# Patient Record
Sex: Female | Born: 1940 | Race: White | Hispanic: No | Smoking: Former smoker
Health system: Southern US, Community
[De-identification: ages and names within clinical notes are randomized; demographics above are authoritative.]

## PROBLEM LIST (undated history)

## (undated) DIAGNOSIS — E119 Type 2 diabetes mellitus without complications: Secondary | ICD-10-CM

## (undated) DIAGNOSIS — C801 Malignant (primary) neoplasm, unspecified: Secondary | ICD-10-CM

## (undated) DIAGNOSIS — I2510 Atherosclerotic heart disease of native coronary artery without angina pectoris: Secondary | ICD-10-CM

## (undated) DIAGNOSIS — I10 Essential (primary) hypertension: Secondary | ICD-10-CM

## (undated) DIAGNOSIS — F419 Anxiety disorder, unspecified: Secondary | ICD-10-CM

## (undated) HISTORY — PX: CORONARY STENT PLACEMENT: SHX1402

## (undated) HISTORY — PX: CHOLECYSTECTOMY: SHX55

## (undated) HISTORY — PX: BREAST BIOPSY: SHX20

## (undated) HISTORY — PX: ABDOMINAL HYSTERECTOMY: SHX81

---

## 2004-09-27 ENCOUNTER — Ambulatory Visit: Payer: Self-pay

## 2006-08-31 ENCOUNTER — Ambulatory Visit: Payer: Self-pay | Admitting: Family Medicine

## 2007-04-11 ENCOUNTER — Ambulatory Visit: Payer: Self-pay | Admitting: Family Medicine

## 2007-10-08 ENCOUNTER — Ambulatory Visit: Payer: Self-pay | Admitting: Family Medicine

## 2008-10-23 ENCOUNTER — Ambulatory Visit: Payer: Self-pay | Admitting: Family Medicine

## 2008-11-21 ENCOUNTER — Ambulatory Visit: Payer: Self-pay | Admitting: Family Medicine

## 2009-12-29 ENCOUNTER — Ambulatory Visit: Payer: Self-pay | Admitting: Family Medicine

## 2010-04-02 ENCOUNTER — Ambulatory Visit: Payer: Self-pay | Admitting: Family Medicine

## 2010-06-18 ENCOUNTER — Emergency Department: Payer: Self-pay | Admitting: Emergency Medicine

## 2010-06-18 ENCOUNTER — Ambulatory Visit: Payer: Self-pay | Admitting: Internal Medicine

## 2011-03-14 ENCOUNTER — Ambulatory Visit: Payer: Self-pay | Admitting: Internal Medicine

## 2011-04-06 ENCOUNTER — Ambulatory Visit: Payer: Self-pay | Admitting: Family Medicine

## 2011-07-13 DIAGNOSIS — F329 Major depressive disorder, single episode, unspecified: Secondary | ICD-10-CM | POA: Insufficient documentation

## 2012-04-06 ENCOUNTER — Ambulatory Visit: Payer: Self-pay | Admitting: Family Medicine

## 2012-08-23 ENCOUNTER — Ambulatory Visit: Payer: Self-pay | Admitting: Emergency Medicine

## 2012-10-02 DIAGNOSIS — M6208 Separation of muscle (nontraumatic), other site: Secondary | ICD-10-CM | POA: Insufficient documentation

## 2013-04-19 ENCOUNTER — Ambulatory Visit: Payer: Self-pay | Admitting: Family Medicine

## 2013-07-03 DIAGNOSIS — M7071 Other bursitis of hip, right hip: Secondary | ICD-10-CM | POA: Insufficient documentation

## 2013-10-16 DIAGNOSIS — E1165 Type 2 diabetes mellitus with hyperglycemia: Secondary | ICD-10-CM

## 2013-10-16 DIAGNOSIS — E1159 Type 2 diabetes mellitus with other circulatory complications: Secondary | ICD-10-CM | POA: Insufficient documentation

## 2013-10-16 DIAGNOSIS — IMO0002 Reserved for concepts with insufficient information to code with codable children: Secondary | ICD-10-CM | POA: Insufficient documentation

## 2013-10-16 DIAGNOSIS — Z794 Long term (current) use of insulin: Secondary | ICD-10-CM

## 2013-10-28 ENCOUNTER — Ambulatory Visit: Payer: Self-pay | Admitting: Surgery

## 2013-10-28 DIAGNOSIS — I2510 Atherosclerotic heart disease of native coronary artery without angina pectoris: Secondary | ICD-10-CM

## 2013-10-28 DIAGNOSIS — Z01810 Encounter for preprocedural cardiovascular examination: Secondary | ICD-10-CM

## 2013-10-28 LAB — BASIC METABOLIC PANEL
ANION GAP: 10 (ref 7–16)
BUN: 15 mg/dL (ref 7–18)
CALCIUM: 9.1 mg/dL (ref 8.5–10.1)
CREATININE: 0.58 mg/dL — AB (ref 0.60–1.30)
Chloride: 106 mmol/L (ref 98–107)
Co2: 25 mmol/L (ref 21–32)
EGFR (African American): >60
Glucose: 163 mg/dL — ABNORMAL HIGH (ref 65–99)
Osmolality: 286 (ref 275–301)
Potassium: 4.1 mmol/L (ref 3.5–5.1)
SODIUM: 141 mmol/L (ref 136–145)

## 2013-10-28 LAB — CBC WITH DIFFERENTIAL/PLATELET
Basophil #: 0 10*3/uL (ref 0.0–0.1)
Basophil %: 0.7 %
Eosinophil #: 0.2 10*3/uL (ref 0.0–0.7)
Eosinophil %: 2.7 %
HCT: 39.4 % (ref 35.0–47.0)
HGB: 12.6 g/dL (ref 12.0–16.0)
LYMPHS ABS: 1.5 10*3/uL (ref 1.0–3.6)
Lymphocyte %: 26.3 %
MCH: 30.4 pg (ref 26.0–34.0)
MCHC: 31.9 g/dL — ABNORMAL LOW (ref 32.0–36.0)
MCV: 95 fL (ref 80–100)
MONO ABS: 0.4 x10 3/mm (ref 0.2–0.9)
Monocyte %: 7.3 %
NEUTROS PCT: 63 %
Neutrophil #: 3.6 10*3/uL (ref 1.4–6.5)
Platelet: 168 10*3/uL (ref 150–440)
RBC: 4.13 10*6/uL (ref 3.80–5.20)
RDW: 13.4 % (ref 11.5–14.5)
WBC: 5.7 10*3/uL (ref 3.6–11.0)

## 2013-11-04 ENCOUNTER — Ambulatory Visit: Payer: Self-pay | Admitting: Surgery

## 2013-11-06 LAB — PATHOLOGY REPORT

## 2013-12-04 ENCOUNTER — Ambulatory Visit: Payer: Self-pay | Admitting: Family Medicine

## 2014-05-13 ENCOUNTER — Ambulatory Visit: Payer: Self-pay | Admitting: Family Medicine

## 2014-05-31 ENCOUNTER — Ambulatory Visit
Admit: 2014-05-31 | Disposition: A | Discharge status: Home / Self Care | Payer: Self-pay | Attending: Family Medicine | Admitting: Family Medicine

## 2014-06-07 ENCOUNTER — Ambulatory Visit
Admit: 2014-06-07 | Disposition: A | Discharge status: Home / Self Care | Payer: Self-pay | Attending: Family Medicine | Admitting: Family Medicine

## 2014-06-07 NOTE — Op Note (Signed)
PATIENT NAME:  Danielle Vang, Danielle Vang MR#:  [id number] DATE OF BIRTH:  February 18, 1940  DATE OF PROCEDURE:  11/04/2013  PREOPERATIVE DIAGNOSIS: Thin melanoma left upper mid back.   POSTOPERATIVE DIAGNOSIS:  Thin melanoma left upper mid back.   PROCEDURE PERFORMED: Wide local re-excision with 1 cm margin and rhomboid rotational flap closure.   SURGEON: Sherri Rad, MD.   ASSISTANT: None.   ANESTHESIA: General with local.   FINDINGS: A 1 cm gross margin from the edge of the ulceration, Grossly negative by pathological analysis.   SPECIMENS: As described above with margin markers.   ESTIMATED BLOOD LOSS: Minimal.   DRAINS: None.   COUNTS:  Lap and needle count correct x 2.   DESCRIPTION OF PROCEDURE: With informed consent the patient was brought to the operating room and positioned supine. General anesthesia was induced. The patient was then padded and positioned prone position. Her back was sterilely prepped and draped with ChloraPrep solution. Timeout was observed. The lesion was identified in the left upper mid back. A rhomboid with 60 degree and 120 degree opposing angles was fashioned. A Limberg flap was then raised, undermined. The defect was then closed in a standard fashion utilizing interrupted deep dermal 3-0 Vicryl sutures and a combination of 3-0 and 4-0 nylon simple and vertical mattress suture. Tension was not found in any of the angles. The flap was viable at the completion of the operation. With lap and needle count correct x 2 dressing was applied and the patient was then returned supine, extubated, and taken to the recovery room in stable and satisfactory condition by anesthesia services.     ____________________________ Jeannette How Marina Gravel, MD mab:bu D: 11/04/2013 17:45:42 ET T: 11/04/2013 21:34:08 ET JOB#: [id number]  cc: Elta Guadeloupe A. Marina Gravel, MD, <Dictator> Gladstone Pih, MD Sevag Shearn Bettina Gavia MD ELECTRONICALLY SIGNED 11/09/2013 16:37

## 2014-08-19 ENCOUNTER — Ambulatory Visit
Admission: EM | Admit: 2014-08-19 | Discharge: 2014-08-19 | Disposition: A | Discharge status: Home / Self Care | Payer: Medicare HMO | Attending: Family Medicine | Admitting: Family Medicine

## 2014-08-19 ENCOUNTER — Ambulatory Visit: Admission: EM | Admit: 2014-08-19 | Discharge: 2014-08-19 | Discharge status: Absent without leave | Payer: Self-pay

## 2014-08-19 DIAGNOSIS — S29012A Strain of muscle and tendon of back wall of thorax, initial encounter: Secondary | ICD-10-CM | POA: Diagnosis not present

## 2014-08-19 HISTORY — DX: Anxiety disorder, unspecified: F41.9

## 2014-08-19 HISTORY — DX: Type 2 diabetes mellitus without complications: E11.9

## 2014-08-19 HISTORY — DX: Essential (primary) hypertension: I10

## 2014-08-19 MED ORDER — HYDROCODONE-ACETAMINOPHEN 5-325 MG PO TABS: 1.0000 | ORAL_TABLET | Freq: Four times a day (QID) | ORAL | Status: DC | PRN

## 2014-08-19 MED ORDER — IBUPROFEN 800 MG PO TABS: 800.0000 mg | ORAL_TABLET | Freq: Three times a day (TID) | ORAL | Status: DC

## 2014-08-19 NOTE — ED Notes (Signed)
Pt states "neck and shoulder pain that started yesterday."

## 2014-08-19 NOTE — ED Provider Notes (Signed)
CSN: [id number]     Arrival date & time 08/19/14  0929 History   First MD Initiated Contact with Patient 08/19/14 1153     Chief Complaint  Patient presents with  . Shoulder Pain   (Consider location/radiation/quality/duration/timing/severity/associated sxs/prior Treatment) HPI Comments: 74 yo female with a 3 days h/o right upper back and neck pain radiating into the shoulder area. States woke up 3 days ago with the pain. Denies any injuries, trauma, falls, numbness/tingling, rash.   The history is provided by the patient.    Past Medical History  Diagnosis Date  . Hypertension   . Diabetes mellitus without complication   . Anxiety    Past Surgical History  Procedure Laterality Date  . Cholecystectomy    . Coronary stent placement    . Abdominal hysterectomy     No family history on file. History  Substance Use Topics  . Smoking status: Never Smoker   . Smokeless tobacco: Not on file  . Alcohol Use: No   OB History    No data available     Review of Systems  Allergies  Review of patient's allergies indicates no known allergies.  Home Medications   Prior to Admission medications   Medication Sig Start Date End Date Taking? Authorizing Provider  ALPRAZolam Duanne Moron) 0.5 MG tablet Take 0.5 mg by mouth at bedtime as needed for anxiety.   Yes Historical Provider, MD  aspirin EC 325 MG tablet Take 325 mg by mouth daily.   Yes Historical Provider, MD  atenolol (TENORMIN) 25 MG tablet Take by mouth daily.   Yes Historical Provider, MD  glipiZIDE (GLUCOTROL) 5 MG tablet Take by mouth daily before breakfast.   Yes Historical Provider, MD  lisinopril (PRINIVIL,ZESTRIL) 10 MG tablet Take 10 mg by mouth daily.   Yes Historical Provider, MD  metFORMIN (GLUCOPHAGE) 1000 MG tablet Take 1,000 mg by mouth 2 (two) times daily with a meal.   Yes Historical Provider, MD  HYDROcodone-acetaminophen (NORCO/VICODIN) 5-325 MG per tablet Take 1-2 tablets by mouth every 6 (six) hours as needed.  08/19/14   Norval Gable, MD  ibuprofen (ADVIL,MOTRIN) 800 MG tablet Take 1 tablet (800 mg total) by mouth 3 (three) times daily. 08/19/14   Norval Gable, MD   BP 148/59 mmHg  Pulse 60  Temp(Src) 98.4 F (36.9 C) (Tympanic)  Resp 16  Ht 5\' 2"  (1.575 m)  Wt 160 lb (72.576 kg)  BMI 29.26 kg/m2  LMP  Physical Exam  Constitutional: She appears well-developed and well-nourished. No distress.  Musculoskeletal: She exhibits no edema.       Cervical back: She exhibits tenderness and spasm. She exhibits normal range of motion, no bony tenderness, no swelling, no edema, no deformity, no laceration and normal pulse.  Tenderness to palpation and muscle spasm over the right trapezius muscle; no bony tenderness; normal ROM of right shoulder, neurovascularly intact  Neurological: She has normal reflexes. She displays normal reflexes. She exhibits normal muscle tone. Coordination normal.  Skin: Skin is warm. No rash noted. She is not diaphoretic. No erythema.  Nursing note and vitals reviewed.   ED Course  Procedures (including critical care time) Labs Review Labs Reviewed - No data to display  Imaging Review No results found.   MDM   1. Muscle strain of right upper back, initial encounter    Discharge Medication List as of 08/19/2014 12:04 PM    START taking these medications   Details  HYDROcodone-acetaminophen (NORCO/VICODIN) 5-325 MG per tablet Take  1-2 tablets by mouth every 6 (six) hours as needed., Starting 08/19/2014, Until Discontinued, Print    ibuprofen (ADVIL,MOTRIN) 800 MG tablet Take 1 tablet (800 mg total) by mouth 3 (three) times daily., Starting 08/19/2014, Until Discontinued, Normal      Plan: 1. diagnosis reviewed with patient 2. rx as per orders; risks, benefits, potential side effects reviewed with patient 3. Recommend supportive treatment with gentle stretching and ROM exercises, ice/heat, massage 4. F/u prn if symptoms worsen or don't improve    Norval Gable,  MD 08/19/14 1452

## 2015-07-31 ENCOUNTER — Other Ambulatory Visit: Payer: Self-pay | Admitting: Family Medicine

## 2015-07-31 DIAGNOSIS — Z1231 Encounter for screening mammogram for malignant neoplasm of breast: Secondary | ICD-10-CM

## 2015-08-17 ENCOUNTER — Ambulatory Visit: Admission: RE | Admit: 2015-08-17 | Payer: Medicare HMO | Source: Ambulatory Visit

## 2015-09-14 ENCOUNTER — Ambulatory Visit
Admission: RE | Admit: 2015-09-14 | Discharge: 2015-09-14 | Disposition: A | Discharge status: Home / Self Care | Payer: Medicare HMO | Source: Ambulatory Visit | Attending: Family Medicine | Admitting: Family Medicine

## 2015-09-14 ENCOUNTER — Encounter: Payer: Self-pay | Admitting: Radiology

## 2015-09-14 DIAGNOSIS — Z1231 Encounter for screening mammogram for malignant neoplasm of breast: Secondary | ICD-10-CM | POA: Insufficient documentation

## 2015-09-14 HISTORY — DX: Malignant (primary) neoplasm, unspecified: C80.1

## 2016-04-20 ENCOUNTER — Other Ambulatory Visit: Payer: Self-pay | Admitting: Family Medicine

## 2016-04-20 DIAGNOSIS — Z1231 Encounter for screening mammogram for malignant neoplasm of breast: Secondary | ICD-10-CM

## 2016-06-15 ENCOUNTER — Ambulatory Visit: Payer: Worker's Compensation

## 2016-06-15 ENCOUNTER — Encounter: Payer: Self-pay | Admitting: *Deleted

## 2016-06-15 ENCOUNTER — Ambulatory Visit
Admission: EM | Admit: 2016-06-15 | Discharge: 2016-06-15 | Disposition: A | Discharge status: Home / Self Care | Payer: Worker's Compensation | Attending: Family Medicine | Admitting: Family Medicine

## 2016-06-15 ENCOUNTER — Ambulatory Visit (INDEPENDENT_AMBULATORY_CARE_PROVIDER_SITE_OTHER): Payer: Worker's Compensation

## 2016-06-15 DIAGNOSIS — S022XXA Fracture of nasal bones, initial encounter for closed fracture: Secondary | ICD-10-CM

## 2016-06-15 DIAGNOSIS — S0083XA Contusion of other part of head, initial encounter: Secondary | ICD-10-CM

## 2016-06-15 DIAGNOSIS — W19XXXA Unspecified fall, initial encounter: Secondary | ICD-10-CM

## 2016-06-15 HISTORY — DX: Atherosclerotic heart disease of native coronary artery without angina pectoris: I25.10

## 2016-06-15 MED ORDER — HYDROCODONE-ACETAMINOPHEN 5-325 MG PO TABS: ORAL_TABLET | ORAL | 0 refills | Status: DC

## 2016-06-15 NOTE — ED Triage Notes (Signed)
Pt tripped on stairs at work and landed on face. Now c/o facial pain, abrasions, and edema. Also, hematoma to left frontal region, and abrasions to both knees.

## 2016-06-15 NOTE — ED Provider Notes (Signed)
[facility]    CSN: [id number] Arrival date & time: 06/15/16  1701     History   Chief Complaint Chief Complaint  Patient presents with  . Facial Injury    HPI Danielle Vang is a 76 y.o. female.   The history is provided by the patient.  Facial Injury  Mechanism of injury:  Fall (missed a step going down some stairs at work and fell hitting her face; denies loss of consciousness, vision changes, nose bleed; pain over bridge of nose and over left eyebrow along with swelling) Location:  Face and nose Time since incident:  3 hours Pain details:    Quality:  Aching   Timing:  Constant Foreign body present:  No foreign bodies Relieved by:  None tried Ineffective treatments:  None tried Associated symptoms: no altered mental status, no congestion, no difficulty breathing, no double vision, no ear pain, no epistaxis, no headaches, no loss of consciousness, no malocclusion, no nausea, no neck pain, no rhinorrhea, no trismus, no vomiting and no wheezing   Risk factors: no alcohol use, no bone disorder, no concern for non-accidental trauma, no frequent falls and no prior injuries to these areas     Past Medical History:  Diagnosis Date  . Anxiety   . Cancer ([place])    skin ca  . Coronary artery disease   . Diabetes mellitus without complication ([place])   . Hypertension     There are no active problems to display for this patient.   Past Surgical History:  Procedure Laterality Date  . ABDOMINAL HYSTERECTOMY    . BREAST BIOPSY Left    neg  . CHOLECYSTECTOMY    . CORONARY STENT PLACEMENT      OB History    No data available       Home Medications    Prior to Admission medications   Medication Sig Start Date End Date Taking? Authorizing Provider  ALPRAZolam Duanne Moron) 0.5 MG tablet Take 0.5 mg by mouth at bedtime as needed for anxiety.   Yes Historical Provider, MD  aspirin EC 325 MG tablet Take 325 mg by mouth daily.   Yes Historical Provider, MD    atenolol (TENORMIN) 25 MG tablet Take by mouth daily.   Yes Historical Provider, MD  glipiZIDE (GLUCOTROL) 5 MG tablet Take by mouth daily before breakfast.   Yes Historical Provider, MD  lisinopril (PRINIVIL,ZESTRIL) 10 MG tablet Take 10 mg by mouth daily.   Yes Historical Provider, MD  metFORMIN (GLUCOPHAGE) 1000 MG tablet Take 1,000 mg by mouth 2 (two) times daily with a meal.   Yes Historical Provider, MD  HYDROcodone-acetaminophen (NORCO/VICODIN) 5-325 MG tablet 1-2 tabs po bid prn 06/15/16   Norval Gable, MD  ibuprofen (ADVIL,MOTRIN) 800 MG tablet Take 1 tablet (800 mg total) by mouth 3 (three) times daily. 08/19/14   Norval Gable, MD    Family History Family History  Problem Relation Age of Onset  . Breast cancer Neg Hx     Social History Social History  Substance Use Topics  . Smoking status: Never Smoker  . Smokeless tobacco: Never Used  . Alcohol use No     Allergies   Patient has no known allergies.   Review of Systems Review of Systems  HENT: Negative for congestion, ear pain, nosebleeds and rhinorrhea.   Eyes: Negative for double vision.  Respiratory: Negative for wheezing.   Gastrointestinal: Negative for nausea and vomiting.  Musculoskeletal: Negative for neck pain.  Neurological: Negative for  loss of consciousness and headaches.     Physical Exam Triage Vital Signs ED Triage Vitals  Enc Vitals Group     BP 06/15/16 1731 (!) 138/51     Pulse Rate 06/15/16 1731 (!) 55     Resp 06/15/16 1731 16     Temp 06/15/16 1731 98.2 F (36.8 C)     Temp Source 06/15/16 1731 Oral     SpO2 06/15/16 1731 95 %     Weight 06/15/16 1733 160 lb (72.6 kg)     Height 06/15/16 1733 5\' 3"  (1.6 m)     Head Circumference --      Peak Flow --      Pain Score --      Pain Loc --      Pain Edu? --      Excl. in [place]? --    No data found.   Updated Vital Signs BP (!) 138/51 (BP Location: Left Arm)   Pulse (!) 55   Temp 98.2 F (36.8 C) (Oral)   Resp 16   Ht 5\' 3"   (1.6 m)   Wt 160 lb (72.6 kg)   SpO2 95%   BMI 28.34 kg/m   Visual Acuity Right Eye Distance:   Left Eye Distance:   Bilateral Distance:    Right Eye Near:   Left Eye Near:    Bilateral Near:     Physical Exam  Constitutional: She is oriented to person, place, and time. She appears well-developed and well-nourished. No distress.  HENT:  Head: Normocephalic and atraumatic.  Right Ear: Tympanic membrane, external ear and ear canal normal.  Left Ear: Tympanic membrane, external ear and ear canal normal.  Nose: Mucosal edema, rhinorrhea and sinus tenderness (over nasal bone) present. No nose lacerations (abrasions over nasal skin), nasal deformity, septal deviation or nasal septal hematoma. No epistaxis.  No foreign bodies. Right sinus exhibits no maxillary sinus tenderness and no frontal sinus tenderness. Left sinus exhibits no maxillary sinus tenderness and no frontal sinus tenderness.  Mouth/Throat: Uvula is midline, oropharynx is clear and moist and mucous membranes are normal. No oropharyngeal exudate.  Eyes: Conjunctivae, EOM and lids are normal. Pupils are equal, round, and reactive to light. Right eye exhibits no discharge. Left eye exhibits no discharge. No scleral icterus.  Mild edema, redness and tenderness slightly above the left eyebrown  Neck: Normal range of motion. Neck supple. No tracheal deviation present. No thyromegaly present.  Cardiovascular: Normal rate, regular rhythm and normal heart sounds.   Pulmonary/Chest: Effort normal and breath sounds normal. No stridor. No respiratory distress. She has no wheezes. She has no rales.  Lymphadenopathy:    She has no cervical adenopathy.  Neurological: She is alert and oriented to person, place, and time. She displays normal reflexes. No cranial nerve deficit or sensory deficit. She exhibits normal muscle tone. Coordination normal.  Skin: She is not diaphoretic.  Psychiatric: She has a normal mood and affect. Her behavior is  normal. Judgment and thought content normal.  Nursing note and vitals reviewed.    UC Treatments / Results  Labs (all labs ordered are listed, but only abnormal results are displayed) Labs Reviewed - No data to display  EKG  EKG Interpretation None       Radiology Dg Facial Bones Complete  Result Date: 06/15/2016 CLINICAL DATA:  Pt tripped on stairs at work and landed on face. Now c/o facial pain, abrasions, and edema. Also, hematoma to left frontal region, left cheek ,  top of nose and abrasions to both knees. EXAM: FACIAL BONES COMPLETE 3+V COMPARISON:  None. FINDINGS: There is a subtle nondisplaced non comminuted fracture of the distal nasal bone. No other facial fractures. The sinuses are clear. There is mild soft tissue swelling over the lower forehead above the nose. IMPRESSION: 1. Subtle nondisplaced, non comminuted fracture of the anterior nasal bone. 2. No other fractures. Electronically Signed   By: Lajean Manes M.D.   On: 06/15/2016 18:27    Procedures Procedures (including critical care time)  Medications Ordered in UC Medications - No data to display   Initial Impression / Assessment and Plan / UC Course  I have reviewed the triage vital signs and the nursing notes.  Pertinent labs & imaging results that were available during my care of the patient were reviewed by me and considered in my medical decision making (see chart for details).       Final Clinical Impressions(s) / UC Diagnoses   Final diagnoses:  Closed fracture of nasal bone, initial encounter  Contusion of face, initial encounter   (non-displaced fracture)  New Prescriptions Discharge Medication List as of 06/15/2016  7:06 PM     1. x-ray results and diagnosis reviewed with patient 2. rx as per orders above; reviewed possible side effects, interactions, risks and benefits  3. Recommend supportive treatment with ice, rest, otc analgesics prn; monitor and follow up with ENT if worsening  symptoms (verbal and written information given) 4. Follow-up prn if symptoms worsen or don't improve   Norval Gable, MD 06/15/16 1925

## 2016-08-24 ENCOUNTER — Ambulatory Visit
Admission: EM | Admit: 2016-08-24 | Discharge: 2016-08-24 | Disposition: A | Discharge status: Home / Self Care | Payer: Medicare HMO | Attending: Family Medicine | Admitting: Family Medicine

## 2016-08-24 ENCOUNTER — Encounter: Payer: Self-pay | Admitting: Emergency Medicine

## 2016-08-24 ENCOUNTER — Ambulatory Visit (INDEPENDENT_AMBULATORY_CARE_PROVIDER_SITE_OTHER): Payer: Medicare HMO

## 2016-08-24 DIAGNOSIS — R8299 Other abnormal findings in urine: Secondary | ICD-10-CM | POA: Diagnosis not present

## 2016-08-24 DIAGNOSIS — R109 Unspecified abdominal pain: Secondary | ICD-10-CM

## 2016-08-24 DIAGNOSIS — R8271 Bacteriuria: Secondary | ICD-10-CM

## 2016-08-24 DIAGNOSIS — T148XXA Other injury of unspecified body region, initial encounter: Secondary | ICD-10-CM

## 2016-08-24 DIAGNOSIS — S39012A Strain of muscle, fascia and tendon of lower back, initial encounter: Secondary | ICD-10-CM | POA: Diagnosis not present

## 2016-08-24 LAB — URINALYSIS, COMPLETE (UACMP) WITH MICROSCOPIC
Bilirubin Urine: NEGATIVE
GLUCOSE, UA: 250 mg/dL — AB
Hgb urine dipstick: NEGATIVE
KETONES UR: NEGATIVE mg/dL
Nitrite: NEGATIVE
PH: 5 (ref 5.0–8.0)
Protein, ur: NEGATIVE mg/dL
Specific Gravity, Urine: 1.025 (ref 1.005–1.030)

## 2016-08-24 MED ORDER — TIZANIDINE HCL 4 MG PO CAPS: 4.0000 mg | ORAL_CAPSULE | Freq: Every evening | ORAL | 0 refills | Status: DC | PRN

## 2016-08-24 MED ORDER — CEPHALEXIN 500 MG PO CAPS: 500.0000 mg | ORAL_CAPSULE | Freq: Two times a day (BID) | ORAL | 0 refills | Status: AC

## 2016-08-24 NOTE — ED Triage Notes (Signed)
Patient c/o lower left sided back pain that started on Saturday.

## 2016-08-24 NOTE — ED Provider Notes (Addendum)
[facility] ____________________________________________  Time seen: Approximately 6:11 PM  I have reviewed the triage vital signs and the nursing notes.   HISTORY  Chief Complaint Back Pain  HPI Danielle Vang is a 76 y.o. female  present for evaluation of left-sided low back pain. Patient reports that she first noticed this back pain approximately one month ago and it then went away without her intervening. Patient states this past weekend she noticed the pain for a little while and it resolved again. Patient states that she then noticed pain again today. Patient describes pain as a dull aching pain. States pain is not sharp or stabbing. No radiation of pain. Denies rash or paresthesias. States pain is overall mild. No alleviating measures taken. Patient states that pain does improve with rest and sitting and comfortable positions. Patient reports she does still have a slight painful sensation when sitting comfortably. Active twisting and bending does increase pain somewhat per patient. States she does have some urinary frequency but states that is not much different than her normal. denies burning with urination, abdominal pain, hematuria, abnormal colored urine. Continues with normal bowel movements. Denies any abnormal colored stool, blood in stool or dark-colored stool. Has continued to remain active. Reports continues to eat and drink well. States currently feels well. Denies recent sickness. Denies recent antibiotic use. Patient reports approximately one month ago she did have a fall after tripping over step and falling forwards. Patient states she does not recall hitting her side or back but states possibly could've had an injury then. Denies any other recent injury. History of CAD and cardiomyopathy. Denies renal insufficiency. Denies known history of kidney stones.   Marcial Pacas, MD: PCP    Past Medical History:  Diagnosis Date  . Anxiety   . Cancer  ([place])    skin ca  . Coronary artery disease   . Diabetes mellitus without complication ([place])   . Hypertension     There are no active problems to display for this patient.   Past Surgical History:  Procedure Laterality Date  . ABDOMINAL HYSTERECTOMY    . BREAST BIOPSY Left    neg  . CHOLECYSTECTOMY    . CORONARY STENT PLACEMENT       No current facility-administered medications for this encounter.   Current Outpatient Prescriptions:  .  ALPRAZolam (XANAX) 0.5 MG tablet, Take 0.5 mg by mouth at bedtime as needed for anxiety., Disp: , Rfl:  .  aspirin EC 325 MG tablet, Take 325 mg by mouth daily., Disp: , Rfl:  .  atenolol (TENORMIN) 25 MG tablet, Take by mouth daily., Disp: , Rfl:  .  cephALEXin (KEFLEX) 500 MG capsule, Take 1 capsule (500 mg total) by mouth 2 (two) times daily., Disp: 10 capsule, Rfl: 0 .  glipiZIDE (GLUCOTROL) 5 MG tablet, Take by mouth daily before breakfast., Disp: , Rfl:  .  HYDROcodone-acetaminophen (NORCO/VICODIN) 5-325 MG tablet, 1-2 tabs po bid prn, Disp: 10 tablet, Rfl: 0 .  ibuprofen (ADVIL,MOTRIN) 800 MG tablet, Take 1 tablet (800 mg total) by mouth 3 (three) times daily., Disp: 30 tablet, Rfl: 0 .  lisinopril (PRINIVIL,ZESTRIL) 10 MG tablet, Take 10 mg by mouth daily., Disp: , Rfl:  .  metFORMIN (GLUCOPHAGE) 1000 MG tablet, Take 1,000 mg by mouth 2 (two) times daily with a meal., Disp: , Rfl:  .  tiZANidine (ZANAFLEX) 4 MG capsule, Take 1 capsule (4 mg total) by mouth at bedtime as needed for muscle spasms., Disp: 12  capsule, Rfl: 0  Allergies Patient has no known allergies.  Family History  Problem Relation Age of Onset  . Breast cancer Neg Hx   Sister: DM  Social History Social History  Substance Use Topics  . Smoking status: Never Smoker  . Smokeless tobacco: Never Used  . Alcohol use No    Review of Systems Constitutional: No fever/chills ENT: No sore throat. Cardiovascular: Denies chest pain. Respiratory: Denies shortness of  breath. Gastrointestinal: No abdominal pain.  No nausea, no vomiting.  No diarrhea.  No constipation. Genitourinary: As above.  Musculoskeletal: Positive for back pain. Skin: Negative for rash.   ____________________________________________   PHYSICAL EXAM:  VITAL SIGNS: ED Triage Vitals  Enc Vitals Group     BP 08/24/16 1712 130/64     Pulse Rate 08/24/16 1712 60     Resp 08/24/16 1712 16     Temp 08/24/16 1712 98.5 F (36.9 C)     Temp Source 08/24/16 1712 Oral     SpO2 08/24/16 1712 96 %     Weight 08/24/16 1710 160 lb (72.6 kg)     Height 08/24/16 1710 5\' 2"  (1.575 m)     Head Circumference --      Peak Flow --      Pain Score 08/24/16 1710 5     Pain Loc --      Pain Edu? --      Excl. in [place]? --     Constitutional: Alert and oriented. Well appearing and in no acute distress. ENT      Head: Normocephalic and atraumatic. Cardiovascular: Normal rate, regular rhythm. Grossly normal heart sounds.  Good peripheral circulation. Respiratory: Normal respiratory effort without tachypnea nor retractions. Breath sounds are clear and equal bilaterally. No wheezes, rales, rhonchi. Gastrointestinal: Soft and nontender. No distention. Normal Bowel sounds. No right CVA tenderness. Musculoskeletal:  Ambulatory with a steady gait. Changes positions quickly in room. No midline cervical, thoracic or lumbar tenderness to palpation. Bilateral pedal pulses equal and easily palpated.      Right lower leg:  No tenderness or edema.      Left lower leg:  No tenderness or edema.  Except: Left posterior to lateral along mid scapular line lower ribs mild tenderness to direct palpation, no swelling, no ecchymosis, no palpable rib fracture, no midline tenderness pain slightly increases with right left rotation, also mild tenderness noted along left lower latissimus dorsi. Neurologic:  Normal speech and language.Speech is normal. No gait instability.  Skin:  Skin is warm, dry Psychiatric: Mood and  affect are normal. Speech and behavior are normal. Patient exhibits appropriate insight and judgment   ___________________________________________   LABS (all labs ordered are listed, but only abnormal results are displayed)  Labs Reviewed  URINALYSIS, COMPLETE (UACMP) WITH MICROSCOPIC - Abnormal; Notable for the following:       Result Value   Glucose, UA 250 (*)    Leukocytes, UA SMALL (*)    Squamous Epithelial / LPF 0-5 (*)    Bacteria, UA FEW (*)    All other components within normal limits  URINE CULTURE   ____________________________________________  RADIOLOGY  Dg Ribs Unilateral W/chest Left  Result Date: 08/24/2016 CLINICAL DATA:  76 y/o F; fall 1 month ago with left-sided lower back pain. EXAM: LEFT RIBS AND CHEST - 3+ VIEW COMPARISON:  None. FINDINGS: No fracture or other bone lesions are seen involving the ribs. There is no evidence of pneumothorax or pleural effusion. Both lungs are clear. Heart  size and mediastinal contours are within normal limits. Calcifications project over the kidneys bilaterally the largest spanning 24 mm on the left, possible nephrolithiasis. Mild lumbar spine levocurvature and moderate multilevel degenerative changes. IMPRESSION: 1. No acute fracture identified. 2. Calcifications project over kidneys, possible nephrolithiasis. 3. Lumbar spine mild levocurvature and degenerative changes. Electronically Signed   By: Kristine Garbe M.D.   On: 08/24/2016 18:40   Reviewed by myself. ____________________________________________   PROCEDURES Procedures    INITIAL IMPRESSION / ASSESSMENT AND PLAN / ED COURSE  Pertinent labs & imaging results that were available during my care of the patient were reviewed by me and considered in my medical decision making (see chart for details).  Well-appearing patient. No acute distress. Presenting for left lower back pain. Some point bony rib tenderness posteriorly and muscular tenderness. No midline  tenderness. Abdomen soft and nontender. Denies other complaints. Will evaluate rib x-ray as well as urinalysis.  Lab and xray results reviewed in detail. Left rib xray per radiology, no fracture identified, calcifications noted over kidneys bilaterally, possible nephrolithiasis with largest 28mm. Urinalysis with few bacteria, will culture urine. Suspect muscular component, however discussed in detail possible nephrolithiasis and infection relation. Will empirically treat with oral cephalexin. Discussed monitor symptoms and close PCP follow up. Discussed may need outpatient CT imaging to clarify calcifications as concerning of size of 60mm. Discussed sooner return parameters including increased pain, fevers, urinary changes or worsening concerns. Patient also reports tolerating zanaflex in past well. Will rx prn hs zanaflex and use of otc tylenol as needed. Cautioned NSAID use with past cardiac history. Encourage rest, fluids and PCP follow up. Discussed indication, risks and benefits of medications with patient.  Discussed follow up with Primary care physician this week. Discussed follow up and return parameters including no resolution or any worsening concerns. Patient verbalized understanding and agreed to plan.   ____________________________________________   FINAL CLINICAL IMPRESSION(S) / ED DIAGNOSES  Final diagnoses:  Left flank pain  Bacteria in urine  Muscle strain     Discharge Medication List as of 08/24/2016  7:07 PM    START taking these medications   Details  cephALEXin (KEFLEX) 500 MG capsule Take 1 capsule (500 mg total) by mouth 2 (two) times daily., Starting Wed 08/24/2016, Until Mon 08/29/2016, Normal    tiZANidine (ZANAFLEX) 4 MG capsule Take 1 capsule (4 mg total) by mouth at bedtime as needed for muscle spasms., Starting Wed 08/24/2016, Normal        Note: This dictation was prepared with Dragon dictation along with smaller phrase technology. Any transcriptional errors  that result from this process are unintentional.         Marylene Land, NP 08/24/16 2035    Marylene Land, NP 08/24/16 2036

## 2016-08-24 NOTE — Discharge Instructions (Signed)
Take medication as prescribed. Rest. Drink plenty of fluids.  ° °Follow up with your primary care physician this week as discussed. Return to Urgent care for new or worsening concerns.  ° °

## 2016-08-26 LAB — URINE CULTURE

## 2016-09-19 ENCOUNTER — Ambulatory Visit
Admission: RE | Admit: 2016-09-19 | Discharge: 2016-09-19 | Disposition: A | Discharge status: Home / Self Care | Payer: Medicare HMO | Source: Ambulatory Visit | Attending: Family Medicine | Admitting: Family Medicine

## 2016-09-19 DIAGNOSIS — Z1231 Encounter for screening mammogram for malignant neoplasm of breast: Secondary | ICD-10-CM | POA: Diagnosis not present

## 2017-01-26 ENCOUNTER — Other Ambulatory Visit: Payer: Self-pay

## 2017-01-26 ENCOUNTER — Ambulatory Visit
Admission: EM | Admit: 2017-01-26 | Discharge: 2017-01-26 | Disposition: A | Discharge status: Home / Self Care | Payer: Medicare HMO | Attending: Family Medicine | Admitting: Family Medicine

## 2017-01-26 DIAGNOSIS — M25571 Pain in right ankle and joints of right foot: Secondary | ICD-10-CM

## 2017-01-26 DIAGNOSIS — I872 Venous insufficiency (chronic) (peripheral): Secondary | ICD-10-CM | POA: Diagnosis not present

## 2017-01-26 NOTE — ED Provider Notes (Addendum)
[facility]    CSN: [id number] Arrival date & time: 01/26/17  1657  History   Chief Complaint Chief Complaint  Patient presents with  . Ankle Pain    Right   HPI  76 year old female presents with foot/ankle swelling.  Bilateral, but right greater than left.  Has been going on for the past several months.  No fall, trauma, injury.  She reports that it seems to be worse as of this morning.  She had pain with ambulation.  Patient has known varicose veins.  She is previously had a prescription for Lasix which she no longer uses.  No chest pain.  No shortness of breath.  Improves with elevation.  No known exacerbating factors.  No other associated symptoms.  No other complaints at this time.  Past Medical History:  Diagnosis Date  . Anxiety   . Cancer ([place])    skin ca  . Coronary artery disease   . Diabetes mellitus without complication ([place])   . Hypertension    Past Surgical History:  Procedure Laterality Date  . ABDOMINAL HYSTERECTOMY    . BREAST BIOPSY Left    neg  . CHOLECYSTECTOMY    . CORONARY STENT PLACEMENT     OB History    No data available     Home Medications    Prior to Admission medications   Medication Sig Start Date End Date Taking? Authorizing Provider  ALPRAZolam Duanne Moron) 0.5 MG tablet Take 0.5 mg by mouth at bedtime as needed for anxiety.   Yes [provider]  aspirin EC 325 MG tablet Take 325 mg by mouth daily.   Yes [provider]  atenolol (TENORMIN) 25 MG tablet Take by mouth daily.   Yes [provider]  glipiZIDE (GLUCOTROL) 5 MG tablet Take by mouth daily before breakfast.   Yes [provider]  lisinopril (PRINIVIL,ZESTRIL) 10 MG tablet Take 10 mg by mouth daily.   Yes [provider]  metFORMIN (GLUCOPHAGE) 1000 MG tablet Take 1,000 mg by mouth 2 (two) times daily with a meal.   Yes [provider]  simvastatin (ZOCOR) 20 MG tablet Take 20 mg by mouth daily.   Yes [provider]    Family History Family History  Problem Relation Age of Onset  . Breast cancer Neg Hx     Social History Social History   Tobacco Use  . Smoking status: Never Smoker  . Smokeless tobacco: Never Used  Substance Use Topics  . Alcohol use: No  . Drug use: No   Allergies   Patient has no known allergies.  Review of Systems Review of Systems  Respiratory: Negative.   Cardiovascular: Positive for leg swelling. Negative for chest pain.   Physical Exam Triage Vital Signs ED Triage Vitals  Enc Vitals Group     BP 01/26/17 1722 (!) 137/47     Pulse Rate 01/26/17 1722 (!) 57     Resp 01/26/17 1722 18     Temp 01/26/17 1722 98.4 F (36.9 C)     Temp Source 01/26/17 1722 Oral     SpO2 01/26/17 1722 98 %     Weight 01/26/17 1719 160 lb (72.6 kg)     Height 01/26/17 1719 5\' 2"  (1.575 m)     Head Circumference --      Peak Flow --      Pain Score 01/26/17 1719 7     Pain Loc --      Pain Edu? --  Excl. in [place]? --    Updated Vital Signs BP (!) 137/47 (BP Location: Left Arm)   Pulse (!) 57   Temp 98.4 F (36.9 C) (Oral)   Resp 18   Ht 5\' 2"  (1.575 m)   Wt 160 lb (72.6 kg)   SpO2 98%   BMI 29.26 kg/m   Physical Exam  Constitutional: She is oriented to person, place, and time. She appears well-developed. No distress.  Cardiovascular: Normal rate and regular rhythm.  2/6 systolic murmur. Lower extremities with varicosities noted.  Pulmonary/Chest: Effort normal and breath sounds normal.  Musculoskeletal:  Bilateral lower extremity edema.  2+ on the right, 1+ on the left.  Normal pulses.  Normal range of motion of the ankles.  No erythema.  Neurological: She is alert and oriented to person, place, and time.  Psychiatric: She has a normal mood and affect. Her behavior is normal.  Vitals reviewed.  UC Treatments / Results  Labs (all labs ordered are listed, but only abnormal results are displayed) Labs Reviewed - No data to display  EKG  EKG  Interpretation None       Radiology No results found.  Procedures Procedures (including critical care time)  Medications Ordered in UC Medications - No data to display   Initial Impression / Assessment and Plan / UC Course  I have reviewed the triage vital signs and the nursing notes.  Pertinent labs & imaging results that were available during my care of the patient were reviewed by me and considered in my medical decision making (see chart for details).     76 year old female presents with lower extremity edema.  This appears to be from venous insufficiency.  Advised compression stockings.  Rx given today.  Advised elevation.  Final Clinical Impressions(s) / UC Diagnoses   Final diagnoses:  Chronic venous insufficiency    ED Discharge Orders    None     Controlled Substance Prescriptions [place] Controlled Substance Registry consulted? Not Applicable   Coral Spikes, DO 01/26/17 [place], [place], DO 01/26/17 1753

## 2017-01-26 NOTE — ED Triage Notes (Signed)
Patient complains of right ankle pain. Patient states that her ankle swells daily but today she has noticed a pain in the ankle. Patient states that her ankle hurts with walking. Patient states that she has had NO injury to this area.

## 2017-03-06 ENCOUNTER — Encounter: Payer: Self-pay | Admitting: Emergency Medicine

## 2017-03-06 ENCOUNTER — Other Ambulatory Visit: Payer: Self-pay

## 2017-03-06 ENCOUNTER — Ambulatory Visit
Admission: EM | Admit: 2017-03-06 | Discharge: 2017-03-06 | Disposition: A | Discharge status: Home / Self Care | Payer: Medicare HMO | Attending: Emergency Medicine | Admitting: Emergency Medicine

## 2017-03-06 DIAGNOSIS — M436 Torticollis: Secondary | ICD-10-CM

## 2017-03-06 DIAGNOSIS — M542 Cervicalgia: Secondary | ICD-10-CM | POA: Diagnosis not present

## 2017-03-06 MED ORDER — IBUPROFEN 400 MG PO TABS: 400.0000 mg | ORAL_TABLET | Freq: Four times a day (QID) | ORAL | 0 refills | Status: DC | PRN

## 2017-03-06 MED ORDER — METHOCARBAMOL 750 MG PO TABS: 750.0000 mg | ORAL_TABLET | ORAL | 0 refills | Status: DC

## 2017-03-06 NOTE — ED Provider Notes (Signed)
HPI  SUBJECTIVE:  Danielle Vang is a 77 y.o. female who presents with bilateral constant neck pain described as soreness, spasm and tightness starting 3 days ago.  She reports her shoulders hurt, bilateral mild ear pain.  Mild gradual onset diffuse headache starting today.  She denies sleeping on it wrong, trauma, fevers, sore throat, numbness, weakness or tingling in her arms or legs.  She states that she has difficulty rotating her head but she is able to flex and extend her neck without much difficulty.  She has never had symptoms like this before.  Denies any change in activity.  No photophobia.  She tried salon Pas patches, aspirin, a roll on arthritis salve and heat without improvement in her symptoms.  Symptoms are worse with head rotation.  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  No history of cancer, GI bleed, kidney disease.  PMD: Marcial Pacas, MD    Past Medical History:  Diagnosis Date  . Anxiety   . Cancer ([place])    skin ca  . Coronary artery disease   . Diabetes mellitus without complication ([place])   . Hypertension     Past Surgical History:  Procedure Laterality Date  . ABDOMINAL HYSTERECTOMY    . BREAST BIOPSY Left    neg  . CHOLECYSTECTOMY    . CORONARY STENT PLACEMENT      Family History  Problem Relation Age of Onset  . Alzheimer's disease Mother   . Migraines Father   . Alcohol abuse Father     Social History   Tobacco Use  . Smoking status: Former Smoker    Last attempt to quit: 03/07/2007    Years since quitting: 10.0  . Smokeless tobacco: Never Used  Substance Use Topics  . Alcohol use: No  . Drug use: No    No current facility-administered medications for this encounter.   Current Outpatient Medications:  .  aspirin EC 325 MG tablet, Take 325 mg by mouth daily., Disp: , Rfl:  .  atenolol (TENORMIN) 25 MG tablet, Take by mouth daily., Disp: , Rfl:  .  cholecalciferol  (VITAMIN D) 1000 units tablet, Take 4,000 Units by mouth daily., Disp: , Rfl:  .  glipiZIDE (GLUCOTROL) 5 MG tablet, Take by mouth daily before breakfast., Disp: , Rfl:  .  insulin aspart protamine- aspart (NOVOLOG MIX 70/30) (70-30) 100 UNIT/ML injection, Inject into the skin. 14 units before breakfast and 20 units before supper, Disp: , Rfl:  .  lisinopril (PRINIVIL,ZESTRIL) 10 MG tablet, Take 10 mg by mouth daily., Disp: , Rfl:  .  metFORMIN (GLUCOPHAGE) 1000 MG tablet, Take 1,000 mg by mouth 2 (two) times daily with a meal., Disp: , Rfl:  .  simvastatin (ZOCOR) 20 MG tablet, Take 20 mg by mouth daily., Disp: , Rfl:  .  ibuprofen (ADVIL,MOTRIN) 400 MG tablet, Take 1 tablet (400 mg total) by mouth every 6 (six) hours as needed., Disp: 30 tablet, Rfl: 0 .  methocarbamol (ROBAXIN) 750 MG tablet, Take 1 tablet (750 mg total) by mouth every 4 (four) hours., Disp: 40 tablet, Rfl: 0  No Known Allergies   ROS  As noted in HPI.   Physical Exam  BP (!) 164/61 (BP Location: Right Arm)   Pulse 79   Temp 99 F (37.2 C) (Oral)   Resp 16   Ht 5\' 2"  (1.575 m)   Wt 160 lb (72.6 kg)   SpO2 97%  BMI 29.26 kg/m   Constitutional: Well developed, well nourished, no acute distress Eyes:  EOMI, conjunctiva normal bilaterally HENT: Normocephalic, atraumatic,mucus membranes moist TMs normal bilaterally. Neck: No C-spine tenderness.  Positive muscle spasms along bilateral trapezius.  No strap muscle tenderness.  No cervical lymphadenopathy.  No meningismus.  Strength with head rotation 5/5 and equal bilaterally, but this aggravates the pain.  She is unable to fully extend her neck, but is able to fully flex it. Respiratory: Normal inspiratory effort Cardiovascular: Normal rate GI: nondistended skin: No rash, skin intact Musculoskeletal: no deformities Neurologic: Alert & oriented x 3, no focal neuro deficits Psychiatric: Speech and behavior appropriate   ED Course   Medications - No data to  display  No orders of the defined types were placed in this encounter.   No results found for this or any previous visit (from the past 24 hour(s)). No results found.  ED Clinical Impression  Torticollis, acute  Neck pain   ED Assessment/Plan  She has no bony tenderness and has no history of trauma.  Deferred x-rays today.  Doubt meningitis.  Presentation consistent with trapezial muscle spasm.  She does have trapezial muscle spasm although not a whole lot of tenderness, so will try some Robaxin, ibuprofen 400 mg with 500 mg to 1 g of Tylenol 3-4 times a day as needed for pain.  Advised deep tissue massage, continued heat.  She will follow-up here or with her primary care physician if this does not help, she will go to the ER if she gets worse.  Discussed  MDM, plan and followup with patient. Discussed sn/sx that should prompt return to the ED. patient agrees with plan.   Meds ordered this encounter  Medications  . methocarbamol (ROBAXIN) 750 MG tablet    Sig: Take 1 tablet (750 mg total) by mouth every 4 (four) hours.    Dispense:  40 tablet    Refill:  0  . ibuprofen (ADVIL,MOTRIN) 400 MG tablet    Sig: Take 1 tablet (400 mg total) by mouth every 6 (six) hours as needed.    Dispense:  30 tablet    Refill:  0    *This clinic note was created using [employer]. Therefore, there may be occasional mistakes despite careful proofreading.   ?   Melynda Ripple, MD 03/06/17 2010

## 2017-03-06 NOTE — ED Triage Notes (Signed)
Patient in today c/o 3 day history of neck pain, shoulder pain, ear pressure and headache. Patient has tried OTC ASA, roll on cream for arthritis and Salon Pas pads.

## 2017-03-06 NOTE — Discharge Instructions (Signed)
400 mg of ibuprofen with 500 mg to 1 g of Tylenol 3-4 times a day as needed for pain.  Do not take the Xanax if you are taking the Robaxin.  Follow-up here with your primary care physician if not better in 3 days, go to the ER if you get worse or for the signs and symptoms we discussed

## 2017-03-09 ENCOUNTER — Telehealth: Payer: Self-pay

## 2017-03-09 NOTE — Telephone Encounter (Signed)
Called to follow up with patient since visit here at [facility]. Patient instructed to call back with any questions or concerns. MAH  

## 2017-08-21 ENCOUNTER — Other Ambulatory Visit: Payer: Self-pay | Admitting: Family Medicine

## 2017-08-21 DIAGNOSIS — Z1231 Encounter for screening mammogram for malignant neoplasm of breast: Secondary | ICD-10-CM

## 2017-09-05 DIAGNOSIS — M48061 Spinal stenosis, lumbar region without neurogenic claudication: Secondary | ICD-10-CM | POA: Insufficient documentation

## 2017-09-25 ENCOUNTER — Ambulatory Visit
Admission: RE | Admit: 2017-09-25 | Discharge: 2017-09-25 | Disposition: A | Discharge status: Home / Self Care | Payer: Medicare HMO | Source: Ambulatory Visit | Attending: Family Medicine | Admitting: Family Medicine

## 2017-09-25 ENCOUNTER — Encounter (INDEPENDENT_AMBULATORY_CARE_PROVIDER_SITE_OTHER): Payer: Self-pay

## 2017-09-25 DIAGNOSIS — Z1231 Encounter for screening mammogram for malignant neoplasm of breast: Secondary | ICD-10-CM | POA: Diagnosis present

## 2017-11-09 ENCOUNTER — Emergency Department: Payer: Medicare HMO

## 2017-11-09 ENCOUNTER — Inpatient Hospital Stay
Admission: EM | Admit: 2017-11-09 | Discharge: 2017-11-13 | DRG: 872 | Disposition: A | Discharge status: Home Health | Payer: Medicare HMO | Attending: Family Medicine | Admitting: Family Medicine

## 2017-11-09 ENCOUNTER — Other Ambulatory Visit: Payer: Self-pay

## 2017-11-09 ENCOUNTER — Encounter: Payer: Self-pay | Admitting: Emergency Medicine

## 2017-11-09 DIAGNOSIS — Z7982 Long term (current) use of aspirin: Secondary | ICD-10-CM | POA: Diagnosis not present

## 2017-11-09 DIAGNOSIS — Z791 Long term (current) use of non-steroidal anti-inflammatories (NSAID): Secondary | ICD-10-CM | POA: Diagnosis not present

## 2017-11-09 DIAGNOSIS — Z79899 Other long term (current) drug therapy: Secondary | ICD-10-CM

## 2017-11-09 DIAGNOSIS — I10 Essential (primary) hypertension: Secondary | ICD-10-CM | POA: Diagnosis present

## 2017-11-09 DIAGNOSIS — E1165 Type 2 diabetes mellitus with hyperglycemia: Secondary | ICD-10-CM | POA: Diagnosis present

## 2017-11-09 DIAGNOSIS — Z9049 Acquired absence of other specified parts of digestive tract: Secondary | ICD-10-CM | POA: Diagnosis not present

## 2017-11-09 DIAGNOSIS — M4800 Spinal stenosis, site unspecified: Secondary | ICD-10-CM | POA: Diagnosis present

## 2017-11-09 DIAGNOSIS — R0902 Hypoxemia: Secondary | ICD-10-CM | POA: Diagnosis present

## 2017-11-09 DIAGNOSIS — I2510 Atherosclerotic heart disease of native coronary artery without angina pectoris: Secondary | ICD-10-CM | POA: Diagnosis present

## 2017-11-09 DIAGNOSIS — Z87891 Personal history of nicotine dependence: Secondary | ICD-10-CM | POA: Diagnosis not present

## 2017-11-09 DIAGNOSIS — Z85828 Personal history of other malignant neoplasm of skin: Secondary | ICD-10-CM

## 2017-11-09 DIAGNOSIS — Z87440 Personal history of urinary (tract) infections: Secondary | ICD-10-CM | POA: Diagnosis not present

## 2017-11-09 DIAGNOSIS — E11649 Type 2 diabetes mellitus with hypoglycemia without coma: Secondary | ICD-10-CM | POA: Diagnosis not present

## 2017-11-09 DIAGNOSIS — R319 Hematuria, unspecified: Secondary | ICD-10-CM | POA: Diagnosis present

## 2017-11-09 DIAGNOSIS — F419 Anxiety disorder, unspecified: Secondary | ICD-10-CM | POA: Diagnosis present

## 2017-11-09 DIAGNOSIS — Z90710 Acquired absence of both cervix and uterus: Secondary | ICD-10-CM

## 2017-11-09 DIAGNOSIS — Z955 Presence of coronary angioplasty implant and graft: Secondary | ICD-10-CM

## 2017-11-09 DIAGNOSIS — T380X5A Adverse effect of glucocorticoids and synthetic analogues, initial encounter: Secondary | ICD-10-CM | POA: Diagnosis present

## 2017-11-09 DIAGNOSIS — A4151 Sepsis due to Escherichia coli [E. coli]: Secondary | ICD-10-CM | POA: Diagnosis not present

## 2017-11-09 DIAGNOSIS — N390 Urinary tract infection, site not specified: Secondary | ICD-10-CM | POA: Diagnosis present

## 2017-11-09 DIAGNOSIS — R32 Unspecified urinary incontinence: Secondary | ICD-10-CM | POA: Diagnosis present

## 2017-11-09 DIAGNOSIS — Z66 Do not resuscitate: Secondary | ICD-10-CM | POA: Diagnosis present

## 2017-11-09 DIAGNOSIS — Y92009 Unspecified place in unspecified non-institutional (private) residence as the place of occurrence of the external cause: Secondary | ICD-10-CM | POA: Diagnosis not present

## 2017-11-09 DIAGNOSIS — A419 Sepsis, unspecified organism: Secondary | ICD-10-CM

## 2017-11-09 DIAGNOSIS — Z794 Long term (current) use of insulin: Secondary | ICD-10-CM

## 2017-11-09 DIAGNOSIS — R05 Cough: Secondary | ICD-10-CM | POA: Diagnosis present

## 2017-11-09 LAB — COMPREHENSIVE METABOLIC PANEL
ALT: 16 U/L (ref 0–44)
AST: 26 U/L (ref 15–41)
Albumin: 3.8 g/dL (ref 3.5–5.0)
Alkaline Phosphatase: 54 U/L (ref 38–126)
Anion gap: 10 (ref 5–15)
BUN: 17 mg/dL (ref 8–23)
CO2: 22 mmol/L (ref 22–32)
CREATININE: 0.77 mg/dL (ref 0.44–1.00)
Calcium: 9.6 mg/dL (ref 8.9–10.3)
Chloride: 104 mmol/L (ref 98–111)
GFR calc Af Amer: >60 mL/min (ref >60)
GFR calc non Af Amer: >60 mL/min (ref >60)
Glucose, Bld: 312 mg/dL — ABNORMAL HIGH (ref 70–99)
Potassium: 4.1 mmol/L (ref 3.5–5.1)
Sodium: 136 mmol/L (ref 135–145)
TOTAL PROTEIN: 6.7 g/dL (ref 6.5–8.1)
Total Bilirubin: 0.7 mg/dL (ref 0.3–1.2)

## 2017-11-09 LAB — CBC WITH DIFFERENTIAL/PLATELET
BASOS PCT: 0 %
Basophils Absolute: 0 10*3/uL (ref 0–0.1)
EOS ABS: 0 10*3/uL (ref 0–0.7)
EOS PCT: 1 %
HCT: 36.7 % (ref 35.0–47.0)
Hemoglobin: 12.8 g/dL (ref 12.0–16.0)
Lymphocytes Relative: 3 %
Lymphs Abs: 0.3 10*3/uL — ABNORMAL LOW (ref 1.0–3.6)
MCH: 32.4 pg (ref 26.0–34.0)
MCHC: 34.9 g/dL (ref 32.0–36.0)
MCV: 92.8 fL (ref 80.0–100.0)
MONOS PCT: 2 %
Monocytes Absolute: 0.2 10*3/uL (ref 0.2–0.9)
Neutro Abs: 8.5 10*3/uL — ABNORMAL HIGH (ref 1.4–6.5)
Neutrophils Relative %: 94 %
Platelets: 154 10*3/uL (ref 150–440)
RBC: 3.95 MIL/uL (ref 3.80–5.20)
RDW: 13.7 % (ref 11.5–14.5)
WBC: 9 10*3/uL (ref 3.6–11.0)

## 2017-11-09 LAB — URINALYSIS, COMPLETE (UACMP) WITH MICROSCOPIC
Bilirubin Urine: NEGATIVE
Ketones, ur: NEGATIVE mg/dL
NITRITE: POSITIVE — AB
Protein, ur: NEGATIVE mg/dL
SPECIFIC GRAVITY, URINE: 1.018 (ref 1.005–1.030)
WBC, UA: >50 WBC/hpf — ABNORMAL HIGH (ref 0–5)
pH: 5 (ref 5.0–8.0)

## 2017-11-09 LAB — HEMOGLOBIN A1C
Hgb A1c MFr Bld: 8.4 % — ABNORMAL HIGH (ref 4.8–5.6)
MEAN PLASMA GLUCOSE: 194.38 mg/dL

## 2017-11-09 LAB — LACTIC ACID, PLASMA
Lactic Acid, Venous: 3.2 mmol/L (ref 0.5–1.9)
Lactic Acid, Venous: 2.2 mmol/L (ref 0.5–1.9)

## 2017-11-09 LAB — GLUCOSE, CAPILLARY: Glucose-Capillary: 301 mg/dL — ABNORMAL HIGH (ref 70–99)

## 2017-11-09 MED ORDER — ENOXAPARIN SODIUM 40 MG/0.4ML ~~LOC~~ SOLN
40.0000 mg | SUBCUTANEOUS | Status: DC
  Administered 2017-11-09 – 2017-11-12 (×4): 40 mg via SUBCUTANEOUS
  Filled 2017-11-09 (×4): qty 0.4

## 2017-11-09 MED ORDER — ACETAMINOPHEN 500 MG PO TABS
500.0000 mg | ORAL_TABLET | Freq: Once | ORAL | Status: AC
  Administered 2017-11-09: 500 mg via ORAL

## 2017-11-09 MED ORDER — ALPRAZOLAM 0.5 MG PO TABS: 0.5000 mg | ORAL_TABLET | Freq: Every evening | ORAL | Status: DC | PRN

## 2017-11-09 MED ORDER — INSULIN ASPART 100 UNIT/ML ~~LOC~~ SOLN
0.0000 [IU] | Freq: Three times a day (TID) | SUBCUTANEOUS | Status: DC
  Administered 2017-11-10: 5 [IU] via SUBCUTANEOUS
  Administered 2017-11-11: 3 [IU] via SUBCUTANEOUS
  Administered 2017-11-12: 8 [IU] via SUBCUTANEOUS
  Administered 2017-11-12 – 2017-11-13 (×4): 3 [IU] via SUBCUTANEOUS
  Filled 2017-11-09 (×8): qty 1

## 2017-11-09 MED ORDER — ONDANSETRON HCL 4 MG/2ML IJ SOLN
4.0000 mg | Freq: Four times a day (QID) | INTRAMUSCULAR | Status: DC | PRN
  Administered 2017-11-10: 4 mg via INTRAVENOUS
  Filled 2017-11-09: qty 2

## 2017-11-09 MED ORDER — INSULIN ASPART PROT & ASPART (70-30 MIX) 100 UNIT/ML ~~LOC~~ SUSP
20.0000 [IU] | Freq: Two times a day (BID) | SUBCUTANEOUS | Status: DC
  Administered 2017-11-10: 20 [IU] via SUBCUTANEOUS
  Filled 2017-11-09: qty 10

## 2017-11-09 MED ORDER — ASPIRIN EC 325 MG PO TBEC
325.0000 mg | DELAYED_RELEASE_TABLET | Freq: Every day | ORAL | Status: DC
  Administered 2017-11-10 – 2017-11-13 (×4): 325 mg via ORAL
  Filled 2017-11-09 (×4): qty 1

## 2017-11-09 MED ORDER — INSULIN ASPART 100 UNIT/ML ~~LOC~~ SOLN
0.0000 [IU] | Freq: Every day | SUBCUTANEOUS | Status: DC
  Administered 2017-11-09: 4 [IU] via SUBCUTANEOUS
  Filled 2017-11-09: qty 1

## 2017-11-09 MED ORDER — SERTRALINE HCL 50 MG PO TABS
50.0000 mg | ORAL_TABLET | Freq: Every day | ORAL | Status: DC
  Administered 2017-11-10 – 2017-11-13 (×4): 50 mg via ORAL
  Filled 2017-11-09 (×4): qty 1

## 2017-11-09 MED ORDER — HYDROCHLOROTHIAZIDE 12.5 MG PO CAPS
12.5000 mg | ORAL_CAPSULE | Freq: Every day | ORAL | Status: DC
  Administered 2017-11-10 – 2017-11-13 (×3): 12.5 mg via ORAL
  Filled 2017-11-09 (×3): qty 1

## 2017-11-09 MED ORDER — LISINOPRIL 20 MG PO TABS
20.0000 mg | ORAL_TABLET | Freq: Every day | ORAL | Status: DC
  Administered 2017-11-10 – 2017-11-13 (×3): 20 mg via ORAL
  Filled 2017-11-09 (×3): qty 1

## 2017-11-09 MED ORDER — ATENOLOL 50 MG PO TABS
50.0000 mg | ORAL_TABLET | Freq: Every day | ORAL | Status: DC
  Administered 2017-11-10 – 2017-11-13 (×3): 50 mg via ORAL
  Filled 2017-11-09 (×4): qty 1

## 2017-11-09 MED ORDER — POLYETHYLENE GLYCOL 3350 17 G PO PACK: 17.0000 g | PACK | Freq: Every day | ORAL | Status: DC | PRN

## 2017-11-09 MED ORDER — MELOXICAM 7.5 MG PO TABS
7.5000 mg | ORAL_TABLET | Freq: Every day | ORAL | Status: DC
  Administered 2017-11-10 – 2017-11-13 (×3): 7.5 mg via ORAL
  Filled 2017-11-09 (×4): qty 1

## 2017-11-09 MED ORDER — SODIUM CHLORIDE 0.9 % IV BOLUS
1000.0000 mL | Freq: Once | INTRAVENOUS | Status: AC
  Administered 2017-11-09: 1000 mL via INTRAVENOUS

## 2017-11-09 MED ORDER — ONDANSETRON HCL 4 MG PO TABS: 4.0000 mg | ORAL_TABLET | Freq: Four times a day (QID) | ORAL | Status: DC | PRN

## 2017-11-09 MED ORDER — ACETAMINOPHEN 325 MG PO TABS
650.0000 mg | ORAL_TABLET | Freq: Four times a day (QID) | ORAL | Status: DC | PRN
  Administered 2017-11-10 – 2017-11-11 (×3): 650 mg via ORAL
  Filled 2017-11-09 (×3): qty 2

## 2017-11-09 MED ORDER — SODIUM CHLORIDE 0.9 % IV SOLN
1.0000 g | Freq: Once | INTRAVENOUS | Status: AC
  Administered 2017-11-09: 1 g via INTRAVENOUS
  Filled 2017-11-09: qty 10

## 2017-11-09 MED ORDER — ACETAMINOPHEN 325 MG PO TABS
650.0000 mg | ORAL_TABLET | Freq: Once | ORAL | Status: AC | PRN
  Administered 2017-11-09: 650 mg via ORAL
  Filled 2017-11-09: qty 2

## 2017-11-09 MED ORDER — ALBUTEROL SULFATE (2.5 MG/3ML) 0.083% IN NEBU: 2.5000 mg | INHALATION_SOLUTION | RESPIRATORY_TRACT | Status: DC | PRN

## 2017-11-09 MED ORDER — ACETAMINOPHEN 500 MG PO TABS
1000.0000 mg | ORAL_TABLET | Freq: Once | ORAL | Status: DC
  Filled 2017-11-09: qty 2

## 2017-11-09 MED ORDER — ACETAMINOPHEN 650 MG RE SUPP: 650.0000 mg | Freq: Four times a day (QID) | RECTAL | Status: DC | PRN

## 2017-11-09 MED ORDER — GLIPIZIDE 10 MG PO TABS
10.0000 mg | ORAL_TABLET | Freq: Every day | ORAL | Status: DC
  Administered 2017-11-10: 10 mg via ORAL
  Filled 2017-11-09 (×2): qty 1

## 2017-11-09 MED ORDER — SIMVASTATIN 20 MG PO TABS
20.0000 mg | ORAL_TABLET | Freq: Every day | ORAL | Status: DC
  Administered 2017-11-09 – 2017-11-12 (×4): 20 mg via ORAL
  Filled 2017-11-09 (×5): qty 1

## 2017-11-09 NOTE — ED Triage Notes (Signed)
Pt comes into the ED via POV c/o chills that started today after lunch at work.  Patient states that all of a sudden she started having chills and trembling.  Patient had her flu shot given to her yesterday and then an injection in her knee.  Patient denies any pain at this time and is in NAD with even and unlabored respirations.

## 2017-11-09 NOTE — ED Provider Notes (Addendum)
[facility] Emergency Department Provider Note  ____________________________________________   First MD Initiated Contact with Patient 11/09/17 1527     (approximate)  I have reviewed the triage vital signs and the nursing notes.   HISTORY  Chief Complaint Chills    HPI Danielle Vang is a 77 y.o. female presents emergency department complaining of fever and chills that started suddenly today.  She states that she had a flu shot yesterday and a cortisone injection in her knee yesterday.  She states that the orthopedic was aware she had her flu shot.  Also she had a epidural type injection into her spine 3 weeks prior.  She denies any chest pain or shortness of breath.  She states she has a chronic cough from her lisinopril but it does seem to be a little worse since yesterday.  She states she also has some chronic UTI problems.  She states she had approximately 3 or 4 this summer they finally got it cleared up with an antibiotic.  She denies any burning with urination at this time.  Patient has a history of diabetes in which she takes metformin and insulin    Past Medical History:  Diagnosis Date  . Anxiety   . Cancer ([place])    skin ca  . Coronary artery disease   . Diabetes mellitus without complication ([place])   . Hypertension     Patient Active Problem List   Diagnosis Date Noted  . UTI (urinary tract infection) 11/09/2017    Past Surgical History:  Procedure Laterality Date  . ABDOMINAL HYSTERECTOMY    . BREAST BIOPSY Left    neg  . CHOLECYSTECTOMY    . CORONARY STENT PLACEMENT      Prior to Admission medications   Medication Sig Start Date End Date Taking? Authorizing Provider  ALPRAZolam Duanne Moron) 0.5 MG tablet Take 0.5 mg by mouth at bedtime as needed for anxiety.   Yes [provider]  aspirin EC 325 MG tablet Take 325 mg by mouth daily.   Yes [provider]  atenolol (TENORMIN) 50 MG tablet Take 50 mg by mouth  daily.    Yes [provider]  cholecalciferol (VITAMIN D) 1000 units tablet Take 4,000 Units by mouth daily.   Yes [provider]  glipiZIDE (GLUCOTROL) 10 MG tablet Take 10 mg by mouth daily before breakfast.    Yes [provider]  hydrochlorothiazide (MICROZIDE) 12.5 MG capsule Take 1 capsule by mouth daily. 08/30/17  Yes [provider]  lisinopril (PRINIVIL,ZESTRIL) 20 MG tablet Take 20 mg by mouth daily.    Yes [provider]  meloxicam (MOBIC) 7.5 MG tablet Take 1 tablet by mouth daily. 09/22/17  Yes [provider]  metFORMIN (GLUCOPHAGE) 1000 MG tablet Take 1,000 mg by mouth 2 (two) times daily with a meal.   Yes [provider]  NOVOLIN 70/30 (70-30) 100 UNIT/ML injection Inject 14-20 Units into the skin 2 (two) times daily. 14UNITS-AM,  20UNITS-PM 10/31/17  Yes [provider]  sertraline (ZOLOFT) 50 MG tablet Take 1 tablet by mouth daily. 08/30/17  Yes [provider]  simvastatin (ZOCOR) 20 MG tablet Take 20 mg by mouth daily.   Yes [provider]  ibuprofen (ADVIL,MOTRIN) 400 MG tablet Take 1 tablet (400 mg total) by mouth every 6 (six) hours as needed. Patient not taking: Reported on 11/09/2017 03/06/17   Melynda Ripple, MD  methocarbamol (ROBAXIN) 750 MG tablet Take 1 tablet (750 mg total)  by mouth every 4 (four) hours. Patient not taking: Reported on 11/09/2017 03/06/17   Melynda Ripple, MD    Allergies Patient has no known allergies.  Family History  Problem Relation Age of Onset  . Alzheimer's disease Mother   . Migraines Father   . Alcohol abuse Father   . Breast cancer Neg Hx     Social History Social History   Tobacco Use  . Smoking status: Former Smoker    Last attempt to quit: 03/07/2007    Years since quitting: 10.6  . Smokeless tobacco: Never Used  Substance Use Topics  . Alcohol use: No  . Drug use: No    Review of Systems  Constitutional: Positive  fever/chills Eyes: No visual changes. ENT: No sore throat. Respiratory: Positive cough Genitourinary: Negative for dysuria. Musculoskeletal: Negative for back pain. Skin: Negative for rash.    ____________________________________________   PHYSICAL EXAM:  VITAL SIGNS: ED Triage Vitals [11/09/17 1445]  Enc Vitals Group     BP (!) 167/52     Pulse Rate 65     Resp 18     Temp (!) 100.5 F (38.1 C)     Temp Source Oral     SpO2 94 %     Weight 150 lb (68 kg)     Height 5\' 2"  (1.575 m)     Head Circumference      Peak Flow      Pain Score 8     Pain Loc      Pain Edu?      Excl. in [place]?     Constitutional: Alert and oriented. Well appearing and in no acute distress.  Patient is talkative and interactive. Eyes: Conjunctivae are normal.  Head: Atraumatic. Nose: No congestion/rhinnorhea. Mouth/Throat: Mucous membranes are moist.   Neck:  supple no lymphadenopathy noted Cardiovascular: Normal rate, regular rhythm. Heart sounds with a mild murmur noted at the sternal border.   Respiratory: Normal respiratory effort.  No retractions, lungs c t a  Abd: soft nontender bs normal all 4 quad GU: deferred Musculoskeletal: FROM all extremities, warm and well perfused Neurologic:  Normal speech and language.  Skin:  Skin is warm, dry and intact. No rash noted. Psychiatric: Mood and affect are normal. Speech and behavior are normal.  ____________________________________________   LABS (all labs ordered are listed, but only abnormal results are displayed)  Labs Reviewed  COMPREHENSIVE METABOLIC PANEL - Abnormal; Notable for the following components:      Result Value   Glucose, Bld 312 (*)    All other components within normal limits  LACTIC ACID, PLASMA - Abnormal; Notable for the following components:   Lactic Acid, Venous 3.2 (*)    All other components within normal limits  LACTIC ACID, PLASMA - Abnormal; Notable for the following components:   Lactic Acid, Venous 2.2  (*)    All other components within normal limits  CBC WITH DIFFERENTIAL/PLATELET - Abnormal; Notable for the following components:   Neutro Abs 8.5 (*)    Lymphs Abs 0.3 (*)    All other components within normal limits  URINALYSIS, COMPLETE (UACMP) WITH MICROSCOPIC - Abnormal; Notable for the following components:   Color, Urine YELLOW (*)    APPearance CLEAR (*)    Glucose, UA >=500 (*)    Hgb urine dipstick SMALL (*)    Nitrite POSITIVE (*)    Leukocytes, UA MODERATE (*)    WBC, UA >50 (*)    Bacteria, UA RARE (*)  All other components within normal limits  CULTURE, BLOOD (ROUTINE X 2)  CULTURE, BLOOD (ROUTINE X 2)  URINE CULTURE  HEMOGLOBIN J1O  BASIC METABOLIC PANEL  CBC   ____________________________________________   ____________________________________________  RADIOLOGY  Chest x-ray is negative  ____________________________________________   PROCEDURES  Procedure(s) performed: Saline lock, Rocephin 1 g IV, saline 1 L IV  Procedures    ____________________________________________   INITIAL IMPRESSION / ASSESSMENT AND PLAN / ED COURSE  Pertinent labs & imaging results that were available during my care of the patient were reviewed by me and considered in my medical decision making (see chart for details).   Patient 77 year old female presents emergency department complaining of sudden onset of fever and chills earlier today.  She states she had a flu shot yesterday along with a cortisone injection into her knee.  She also had a cortisone injection into her spine 3 weeks ago.  She denies any other symptoms at this time other than a chronic cough.  On physical exam the patient is very hot to touch.  She is talkative.  Her normal faculties appear to be intact.  Her daughter is present during the exam.  The exam is basically unremarkable.  Differential diagnosis is UTI, sepsis, spinal abscess due to the recent injection, viral illness, pneumonia  CBC with  WBC of 9.0, neutrophils elevated at 8.5, lymphocytes decreased at 0.3, comprehensive metabolic panel shows an elevated glucose at 312, the UA shows greater than 500 glucose, small amount of hemoglobin, positive nitrates, moderate leuks, greater than 50 white blood cells, blood cultures and urine culture are pending.  The lactic acid is elevated at 3.2  Due to the elevated lactic acid 3.2 discussed the case with Dr. Cherylann Banas.  He agrees with my treatment plan at this time.  He states that he agrees she should be admitted.  Hospitalist Sudini notified and report was given.  The patient will be admitted due to sepsis and UTI  Clinical Course as of Nov 09 2000  Thu Nov 09, 2017  1756 CBC [CF]  1923 Lactic Acid, Venous(!!): 2.2 [CF]    Clinical Course User Index [CF] Kathlen Mody, Cadence H, Student-PA    As part of my medical decision making, I reviewed the following data within the [place] History obtained from family, Nursing notes reviewed and incorporated, Labs reviewed lactic acid 3.2, UA positive nitrites and leuks along with elevated glucose, basic metabolic panel shows elevated glucose, EKG interpreted NSR, Old chart reviewed, Radiograph reviewed chest x-ray negative, Discussed with admitting physician Sudini, Notes from prior ED visits and [place] Controlled Substance Database  ____________________________________________   FINAL CLINICAL IMPRESSION(S) / ED DIAGNOSES  Final diagnoses:  Sepsis, due to unspecified organism  Urinary tract infection with hematuria, site unspecified      NEW MEDICATIONS STARTED DURING THIS VISIT:  Current Discharge Medication List       Note:  This document was prepared using Dragon voice recognition software and may include unintentional dictation errors.    Versie Starks, PA-C 11/09/17 1729    Versie Starks, PA-C 11/09/17 Barry Dienes, MD 11/10/17 [phone]

## 2017-11-09 NOTE — H&P (Signed)
[place] [facility] [place] NAME: Danielle Vang    MR#:  [id number]  DATE OF BIRTH:  1941/01/22  DATE OF ADMISSION:  11/09/2017  PRIMARY CARE PHYSICIAN: Marcial Pacas, MD   REQUESTING/REFERRING PHYSICIAN: Dr. Cinda Quest  CHIEF COMPLAINT:   Chief Complaint  Patient presents with  . Chills    HISTORY OF PRESENT ILLNESS:  Danielle Vang  is a 77 y.o. female with a known history of diabetes, hypertension, recurrent UTIs presents to the hospital due to acute onset of chills and fever at work.  Patient felt extremely weak and sweaty.  Arrived to the emergency room was found to have fever of 101.5.  Found to have UTI and elevated lactic acid of 3.2.  Patient is being admitted for UTI with elevated lactic acid.  Patient feels fatigued.  No abdominal pain or vomiting.  No recent antibiotic use.  Does get recurrent UTIs.  Today her blood sugar is significantly elevated at 312.  She has received a steroid injection for spinal stenosis few days back.  PAST MEDICAL HISTORY:   Past Medical History:  Diagnosis Date  . Anxiety   . Cancer ([place])    skin ca  . Coronary artery disease   . Diabetes mellitus without complication ([place])   . Hypertension     PAST SURGICAL HISTORY:   Past Surgical History:  Procedure Laterality Date  . ABDOMINAL HYSTERECTOMY    . BREAST BIOPSY Left    neg  . CHOLECYSTECTOMY    . CORONARY STENT PLACEMENT      SOCIAL HISTORY:   Social History   Tobacco Use  . Smoking status: Former Smoker    Last attempt to quit: 03/07/2007    Years since quitting: 10.6  . Smokeless tobacco: Never Used  Substance Use Topics  . Alcohol use: No    FAMILY HISTORY:   Family History  Problem Relation Age of Onset  . Alzheimer's disease Mother   . Migraines Father   . Alcohol abuse Father   . Breast cancer Neg Hx     DRUG ALLERGIES:  No Known Allergies  REVIEW OF SYSTEMS:   Review of Systems  Constitutional: Positive for  fever and malaise/fatigue. Negative for chills.  HENT: Negative for sore throat.   Eyes: Negative for blurred vision, double vision and pain.  Respiratory: Negative for cough, hemoptysis, shortness of breath and wheezing.   Cardiovascular: Negative for chest pain, palpitations, orthopnea and leg swelling.  Gastrointestinal: Negative for abdominal pain, constipation, diarrhea, heartburn, nausea and vomiting.  Genitourinary: Positive for frequency. Negative for dysuria and hematuria.  Musculoskeletal: Negative for back pain and joint pain.  Skin: Negative for rash.  Neurological: Negative for sensory change, speech change, focal weakness and headaches.  Endo/Heme/Allergies: Does not bruise/bleed easily.  Psychiatric/Behavioral: Negative for depression. The patient is not nervous/anxious.     MEDICATIONS AT HOME:   Prior to Admission medications   Medication Sig Start Date End Date Taking? Authorizing Provider  ALPRAZolam Duanne Moron) 0.5 MG tablet Take 0.5 mg by mouth at bedtime as needed for anxiety.   Yes [provider]  aspirin EC 325 MG tablet Take 325 mg by mouth daily.   Yes [provider]  atenolol (TENORMIN) 50 MG tablet Take 50 mg by mouth daily.    Yes [provider]  cholecalciferol (VITAMIN D) 1000 units tablet Take 4,000 Units by mouth daily.   Yes [provider]  glipiZIDE (GLUCOTROL) 10 MG tablet Take  10 mg by mouth daily before breakfast.    Yes [provider]  hydrochlorothiazide (MICROZIDE) 12.5 MG capsule Take 1 capsule by mouth daily. 08/30/17  Yes [provider]  lisinopril (PRINIVIL,ZESTRIL) 20 MG tablet Take 20 mg by mouth daily.    Yes [provider]  meloxicam (MOBIC) 7.5 MG tablet Take 1 tablet by mouth daily. 09/22/17  Yes [provider]  metFORMIN (GLUCOPHAGE) 1000 MG tablet Take 1,000 mg by mouth 2 (two) times daily with a meal.   Yes [provider]  NOVOLIN 70/30 (70-30) 100  UNIT/ML injection Inject 14-20 Units into the skin 2 (two) times daily. 14UNITS-AM,  20UNITS-PM 10/31/17  Yes [provider]  sertraline (ZOLOFT) 50 MG tablet Take 1 tablet by mouth daily. 08/30/17  Yes [provider]  simvastatin (ZOCOR) 20 MG tablet Take 20 mg by mouth daily.   Yes [provider]  ibuprofen (ADVIL,MOTRIN) 400 MG tablet Take 1 tablet (400 mg total) by mouth every 6 (six) hours as needed. Patient not taking: Reported on 11/09/2017 03/06/17   Melynda Ripple, MD  methocarbamol (ROBAXIN) 750 MG tablet Take 1 tablet (750 mg total) by mouth every 4 (four) hours. Patient not taking: Reported on 11/09/2017 03/06/17   Melynda Ripple, MD     VITAL SIGNS:  Blood pressure (!) 133/42, pulse 65, temperature (!) 101.6 F (38.7 C), temperature source Oral, resp. rate 18, height 5\' 2"  (1.575 m), weight 68 kg, SpO2 93 %.  PHYSICAL EXAMINATION:  Physical Exam  GENERAL:  77 y.o.-year-old patient lying in the bed with no acute distress.  EYES: Pupils equal, round, reactive to light and accommodation. No scleral icterus. Extraocular muscles intact.  HEENT: Head atraumatic, normocephalic. Oropharynx and nasopharynx clear. No oropharyngeal erythema, moist oral mucosa  NECK:  Supple, no jugular venous distention. No thyroid enlargement, no tenderness.  LUNGS: Normal breath sounds bilaterally, no wheezing, rales, rhonchi. No use of accessory muscles of respiration.  CARDIOVASCULAR: S1, S2 normal. No murmurs, rubs, or gallops.  ABDOMEN: Soft, nontender, nondistended. Bowel sounds present. No organomegaly or mass.  EXTREMITIES: No pedal edema, cyanosis, or clubbing. + 2 pedal & radial pulses b/l.   NEUROLOGIC: Cranial nerves II through XII are intact. No focal Motor or sensory deficits appreciated b/l PSYCHIATRIC: The patient is alert and oriented x 3. Good affect.  SKIN: No obvious rash, lesion, or ulcer.   LABORATORY PANEL:   CBC Recent Labs  Lab 11/09/17 1605   WBC 9.0  HGB 12.8  HCT 36.7  PLT 154   ------------------------------------------------------------------------------------------------------------------  Chemistries  Recent Labs  Lab 11/09/17 1605  NA 136  K 4.1  CL 104  CO2 22  GLUCOSE 312*  BUN 17  CREATININE 0.77  CALCIUM 9.6  AST 26  ALT 16  ALKPHOS 54  BILITOT 0.7   ------------------------------------------------------------------------------------------------------------------  Cardiac Enzymes No results for input(s): TROPONINI in the last 168 hours. ------------------------------------------------------------------------------------------------------------------  RADIOLOGY:  Dg Chest 2 View  Result Date: 11/09/2017 CLINICAL DATA:  Chills, fever. EXAM: CHEST - 2 VIEW COMPARISON:  Radiographs of August 24, 2016. FINDINGS: The heart size and mediastinal contours are within normal limits. Both lungs are clear. No pneumothorax or pleural effusion is noted. The visualized skeletal structures are unremarkable. IMPRESSION: No active cardiopulmonary disease. Electronically Signed   By: Marijo Conception, M.D.   On: 11/09/2017 16:39     IMPRESSION AND PLAN:   *UTI with elevated lactic acid.  Admit patient.  Start IV ceftriaxone.  IV fluid  bolus stat.  Repeat lactic acid in 3 hours.  Check cultures.  *Diabetes mellitus, uncontrolled.  Likely due to steroid injection for spinal stenosis.  Will continue home dose of NPH and add sliding scale insulin.  *Hypertension.  Continue home medications.  DVT prophylaxis with Lovenox  All the records are reviewed and case discussed with ED provider. Management plans discussed with the patient, family and they are in agreement.  CODE STATUS: DO NOT RESUSCITATE  TOTAL TIME TAKING CARE OF THIS PATIENT: 40 minutes.   Leia Alf Kieran Nachtigal M.D on 11/09/2017 at 5:42 PM  Between 7am to 6pm - Pager - [phone]  After 6pm go to [email or web address] - password [email or web address]  Office  [phone]  CC: Primary care physician; Marcial Pacas, MD  Note: This dictation was prepared with Dragon dictation along with smaller phrase technology. Any transcriptional errors that result from this process are unintentional.

## 2017-11-09 NOTE — Progress Notes (Signed)
  Advance care planning  Purpose of Encounter Acute hospitalization due to UTI and CODE STATUS discussion  Parties in Attendance Patient  Patients Decisional capacity Patient is alert and oriented.  Able to make medical decisions.  Patient has documented healthcare power of attorney.  Daughter-Danielle Vang  Discussed with patient regarding her UTI and elevated lactic acid and need for admission.  Patient agrees and all questions answered. Discussed CODE STATUS.  Patient tells me that she is 42 and would not like aggressive measures if she were to have respiratory or cardiac arrest.  Wants to be DO NOT RESUSCITATE and DO NOT INTUBATE.  Orders entered.  Time spent - 20 minutes

## 2017-11-10 LAB — CBC
HEMATOCRIT: 34.2 % — AB (ref 35.0–47.0)
Hemoglobin: 11.5 g/dL — ABNORMAL LOW (ref 12.0–16.0)
MCH: 31.6 pg (ref 26.0–34.0)
MCHC: 33.6 g/dL (ref 32.0–36.0)
MCV: 93.9 fL (ref 80.0–100.0)
PLATELETS: 124 10*3/uL — AB (ref 150–440)
RBC: 3.64 MIL/uL — ABNORMAL LOW (ref 3.80–5.20)
RDW: 13.6 % (ref 11.5–14.5)
WBC: 7.1 10*3/uL (ref 3.6–11.0)

## 2017-11-10 LAB — BASIC METABOLIC PANEL
Anion gap: 10 (ref 5–15)
BUN: 14 mg/dL (ref 8–23)
CO2: 23 mmol/L (ref 22–32)
Calcium: 8.7 mg/dL — ABNORMAL LOW (ref 8.9–10.3)
Chloride: 106 mmol/L (ref 98–111)
Creatinine, Ser: 0.75 mg/dL (ref 0.44–1.00)
GFR calc Af Amer: >60 mL/min (ref >60)
Glucose, Bld: 264 mg/dL — ABNORMAL HIGH (ref 70–99)
POTASSIUM: 4.1 mmol/L (ref 3.5–5.1)
SODIUM: 139 mmol/L (ref 135–145)

## 2017-11-10 LAB — GLUCOSE, CAPILLARY
GLUCOSE-CAPILLARY: 216 mg/dL — AB (ref 70–99)
GLUCOSE-CAPILLARY: 54 mg/dL — AB (ref 70–99)
GLUCOSE-CAPILLARY: 97 mg/dL (ref 70–99)
Glucose-Capillary: 238 mg/dL — ABNORMAL HIGH (ref 70–99)
Glucose-Capillary: 246 mg/dL — ABNORMAL HIGH (ref 70–99)
Glucose-Capillary: 115 mg/dL — ABNORMAL HIGH (ref 70–99)
Glucose-Capillary: 97 mg/dL (ref 70–99)

## 2017-11-10 MED ORDER — INSULIN ASPART PROT & ASPART (70-30 MIX) 100 UNIT/ML ~~LOC~~ SUSP
20.0000 [IU] | Freq: Every day | SUBCUTANEOUS | Status: DC
  Administered 2017-11-10: 20 [IU] via SUBCUTANEOUS
  Filled 2017-11-10: qty 10

## 2017-11-10 MED ORDER — SODIUM CHLORIDE 0.9 % IV SOLN
1.0000 g | Freq: Two times a day (BID) | INTRAVENOUS | Status: DC
  Administered 2017-11-10 – 2017-11-11 (×3): 1 g via INTRAVENOUS
  Filled 2017-11-10 (×4): qty 1

## 2017-11-10 MED ORDER — SODIUM CHLORIDE 0.9 % IV SOLN: 1.0000 g | INTRAVENOUS | Status: DC

## 2017-11-10 MED ORDER — SODIUM CHLORIDE 0.9 % IV SOLN
INTRAVENOUS | Status: AC
  Administered 2017-11-10: 06:00:00 via INTRAVENOUS

## 2017-11-10 MED ORDER — SODIUM CHLORIDE 0.9 % IV SOLN
INTRAVENOUS | Status: AC
  Administered 2017-11-10: 16:00:00 via INTRAVENOUS

## 2017-11-10 MED ORDER — INSULIN ASPART PROT & ASPART (70-30 MIX) 100 UNIT/ML ~~LOC~~ SUSP
14.0000 [IU] | Freq: Every day | SUBCUTANEOUS | Status: DC
  Filled 2017-11-10: qty 10

## 2017-11-10 NOTE — Progress Notes (Signed)
[place] at [facility]                                                                                                                                                                                  Patient Demographics   Danielle Vang, is a 77 y.o. female, DOB - 11-07-1940, JAS:[id number]  Admit date - 11/09/2017   Admitting Physician Hillary Bow, MD  Outpatient Primary MD for the patient is Ringel, Fanny Dance, MD   LOS - 1  Subjective: Patient admitted with with chills noted to have UTI patient states that she is feeling better History of recurrent UTIs [place] of urinary incontinence which is chronic  Review of Systems:   CONSTITUTIONAL: No documented fever. No fatigue, weakness. No weight gain, no weight loss.  EYES: No blurry or double vision.  ENT: No tinnitus. No postnasal drip. No redness of the oropharynx.  RESPIRATORY: No cough, no wheeze, no hemoptysis. No dyspnea.  CARDIOVASCULAR: No chest pain. No orthopnea. No palpitations. No syncope.  GASTROINTESTINAL: No nausea, no vomiting or diarrhea. No abdominal pain. No melena or hematochezia.  GENITOURINARY: No dysuria or hematuria.  Positive urinary and creatinine ENDOCRINE: No polyuria or nocturia. No heat or cold intolerance.  HEMATOLOGY: No anemia. No bruising. No bleeding.  INTEGUMENTARY: No rashes. No lesions.  MUSCULOSKELETAL: No arthritis. No swelling. No gout.  NEUROLOGIC: No numbness, tingling, or ataxia. No seizure-type activity.  PSYCHIATRIC: No anxiety. No insomnia. No ADD.    Vitals:   Vitals:   11/10/17 0528 11/10/17 0728 11/10/17 1042 11/10/17 1151  BP: (!) 135/54  (!) 130/59 (!) 107/50  Pulse: 79  68 (!) 57  Resp: 20   18  Temp: (!) 102.3 F (39.1 C) 99.7 F (37.6 C)  99.3 F (37.4 C)  TempSrc: Oral Oral  Oral  SpO2: 90%   93%  Weight:      Height:        Wt Readings from Last 3 Encounters:  11/09/17 74.7 kg  03/06/17 72.6 kg  01/26/17 72.6 kg      Intake/Output Summary (Last 24 hours) at 11/10/2017 1715 Last data filed at 11/10/2017 1432 Gross per 24 hour  Intake 2363.64 ml  Output 1500 ml  Net 863.64 ml    Physical Exam:   GENERAL: Pleasant-appearing in no apparent distress.  HEAD, EYES, EARS, NOSE AND THROAT: Atraumatic, normocephalic. Extraocular muscles are intact. Pupils equal and reactive to light. Sclerae anicteric. No conjunctival injection. No oro-pharyngeal erythema.  NECK: Supple. There is no jugular venous distention. No bruits, no lymphadenopathy, no thyromegaly.  HEART: Regular rate and rhythm,. No murmurs, no rubs, no clicks.  LUNGS: Clear to auscultation  bilaterally. No rales or rhonchi. No wheezes.  ABDOMEN: Soft, flat, nontender, nondistended. Has good bowel sounds. No hepatosplenomegaly appreciated.  EXTREMITIES: No evidence of any cyanosis, clubbing, or peripheral edema.  +2 pedal and radial pulses bilaterally.  NEUROLOGIC: The patient is alert, awake, and oriented x3 with no focal motor or sensory deficits appreciated bilaterally.  SKIN: Moist and warm with no rashes appreciated.  Psych: Not anxious, depressed LN: No inguinal LN enlargement    Antibiotics   Anti-infectives (From admission, onward)   Start     Dose/Rate Route Frequency Ordered Stop   11/10/17 1700  cefTRIAXone (ROCEPHIN) 1 g in sodium chloride 0.9 % 100 mL IVPB  Status:  Discontinued     1 g 200 mL/hr over 30 Minutes Intravenous Every 24 hours 11/10/17 0244 11/10/17 0510   11/10/17 0515  meropenem (MERREM) 1 g in sodium chloride 0.9 % 100 mL IVPB     1 g 200 mL/hr over 30 Minutes Intravenous Every 12 hours 11/10/17 0510     11/09/17 1645  cefTRIAXone (ROCEPHIN) 1 g in sodium chloride 0.9 % 100 mL IVPB     1 g 200 mL/hr over 30 Minutes Intravenous  Once 11/09/17 1638 11/09/17 1725      Medications   Scheduled Meds: . aspirin EC  325 mg Oral Daily  . atenolol  50 mg Oral Daily  . enoxaparin (LOVENOX) injection  40 mg  Subcutaneous Q24H  . glipiZIDE  10 mg Oral QAC breakfast  . hydrochlorothiazide  12.5 mg Oral Daily  . insulin aspart  0-15 Units Subcutaneous TID WC  . insulin aspart  0-5 Units Subcutaneous QHS  . [START ON 11/11/2017] insulin aspart protamine- aspart  14 Units Subcutaneous Q breakfast  . insulin aspart protamine- aspart  20 Units Subcutaneous Q supper  . lisinopril  20 mg Oral Daily  . meloxicam  7.5 mg Oral Daily  . sertraline  50 mg Oral Daily  . simvastatin  20 mg Oral Daily   Continuous Infusions: . sodium chloride 100 mL/hr at 11/10/17 1623  . meropenem (MERREM) IV 1 g (11/10/17 1626)   PRN Meds:.acetaminophen **OR** acetaminophen, albuterol, ALPRAZolam, ondansetron **OR** ondansetron (ZOFRAN) IV, polyethylene glycol   Data Review:   Micro Results Recent Results (from the past 240 hour(s))  Blood culture (routine x 2)     Status: None (Preliminary result)   Collection Time: 11/09/17  4:15 PM  Result Value Ref Range Status   Specimen Description BLOOD BLOOD LEFT FOREARM  Final   Special Requests   Final    BOTTLES DRAWN AEROBIC AND ANAEROBIC Blood Culture results may not be optimal due to an excessive volume of blood received in culture bottles   Culture  Setup Time   Final    GRAM NEGATIVE RODS IN BOTH AEROBIC AND ANAEROBIC BOTTLES CRITICAL VALUE NOTED.  VALUE IS CONSISTENT WITH PREVIOUSLY REPORTED AND CALLED VALUE. Performed at [facility], [address] [place], [place] [address]    Culture GRAM NEGATIVE RODS  Final   Report Status PENDING  Incomplete  Blood culture (routine x 2)     Status: None (Preliminary result)   Collection Time: 11/09/17  4:15 PM  Result Value Ref Range Status   Specimen Description BLOOD RIGHT ANTECUBITAL  Final   Special Requests   Final    BOTTLES DRAWN AEROBIC AND ANAEROBIC Blood Culture results may not be optimal due to an excessive volume of blood received in culture bottles   Culture  Setup  Time   Final    GRAM NEGATIVE  RODS IN BOTH AEROBIC AND ANAEROBIC BOTTLES CRITICAL RESULT CALLED TO, READ BACK BY AND VERIFIED WITH: C/DAVID BESANTI @0420  11/10/17 FLC Performed at [facility], [address] [place], [place] [address]    Culture GRAM NEGATIVE RODS  Final   Report Status PENDING  Incomplete  Blood Culture ID Panel (Reflexed)     Status: Abnormal   Collection Time: 11/09/17  4:15 PM  Result Value Ref Range Status   Enterococcus species NOT DETECTED NOT DETECTED Final   Listeria monocytogenes NOT DETECTED NOT DETECTED Final   Staphylococcus species NOT DETECTED NOT DETECTED Final   Staphylococcus aureus NOT DETECTED NOT DETECTED Final   Streptococcus species NOT DETECTED NOT DETECTED Final   Streptococcus agalactiae NOT DETECTED NOT DETECTED Final   Streptococcus pneumoniae NOT DETECTED NOT DETECTED Final   Streptococcus pyogenes NOT DETECTED NOT DETECTED Final   Acinetobacter baumannii NOT DETECTED NOT DETECTED Final   Enterobacteriaceae species DETECTED (A) NOT DETECTED Final    Comment: Enterobacteriaceae represent a large family of gram-negative bacteria, not a single organism. C/DAVID BESANTI @0420  11/10/17 FLC    Enterobacter cloacae complex NOT DETECTED NOT DETECTED Final   Escherichia coli DETECTED (A) NOT DETECTED Final    Comment: C/DAVID BESANTI @0420  11/10/17 FLC   Klebsiella oxytoca NOT DETECTED NOT DETECTED Final   Klebsiella pneumoniae NOT DETECTED NOT DETECTED Final   Proteus species NOT DETECTED NOT DETECTED Final   Serratia marcescens NOT DETECTED NOT DETECTED Final   Carbapenem resistance NOT DETECTED NOT DETECTED Final   Haemophilus influenzae NOT DETECTED NOT DETECTED Final   Neisseria meningitidis NOT DETECTED NOT DETECTED Final   Pseudomonas aeruginosa NOT DETECTED NOT DETECTED Final   Candida albicans NOT DETECTED NOT DETECTED Final   Candida glabrata NOT DETECTED NOT DETECTED Final   Candida krusei NOT DETECTED NOT DETECTED Final   Candida parapsilosis NOT  DETECTED NOT DETECTED Final   Candida tropicalis NOT DETECTED NOT DETECTED Final    Comment: Performed at [facility], [address] [place], [place] [address]    Radiology Reports Dg Chest 2 View  Result Date: 11/09/2017 CLINICAL DATA:  Chills, fever. EXAM: CHEST - 2 VIEW COMPARISON:  Radiographs of August 24, 2016. FINDINGS: The heart size and mediastinal contours are within normal limits. Both lungs are clear. No pneumothorax or pleural effusion is noted. The visualized skeletal structures are unremarkable. IMPRESSION: No active cardiopulmonary disease. Electronically Signed   By: Marijo Conception, M.D.   On: 11/09/2017 16:39     CBC Recent Labs  Lab 11/09/17 1605 11/10/17 0600  WBC 9.0 7.1  HGB 12.8 11.5*  HCT 36.7 34.2*  PLT 154 124*  MCV 92.8 93.9  MCH 32.4 31.6  MCHC 34.9 33.6  RDW 13.7 13.6  LYMPHSABS 0.3*  --   MONOABS 0.2  --   EOSABS 0.0  --   BASOSABS 0.0  --     Chemistries  Recent Labs  Lab 11/09/17 1605 11/10/17 0600  NA 136 139  K 4.1 4.1  CL 104 106  CO2 22 23  GLUCOSE 312* 264*  BUN 17 14  CREATININE 0.77 0.75  CALCIUM 9.6 8.7*  AST 26  --   ALT 16  --   ALKPHOS 54  --   BILITOT 0.7  --    ------------------------------------------------------------------------------------------------------------------ estimated creatinine clearance is 55.7 mL/min (by C-G formula based on SCr of 0.75 mg/dL). ------------------------------------------------------------------------------------------------------------------ Recent Labs  11/09/17 1605  HGBA1C 8.4*   ------------------------------------------------------------------------------------------------------------------ No results for input(s): CHOL, HDL, LDLCALC, TRIG, CHOLHDL, LDLDIRECT in the last 72 hours. ------------------------------------------------------------------------------------------------------------------ No results for input(s): TSH, T4TOTAL, T3FREE, THYROIDAB in the last  72 hours.  Invalid input(s): FREET3 ------------------------------------------------------------------------------------------------------------------ No results for input(s): VITAMINB12, FOLATE, FERRITIN, TIBC, IRON, RETICCTPCT in the last 72 hours.  Coagulation profile No results for input(s): INR, PROTIME in the last 168 hours.  No results for input(s): DDIMER in the last 72 hours.  Cardiac Enzymes No results for input(s): CKMB, TROPONINI, MYOGLOBIN in the last 168 hours.  Invalid input(s): CK ------------------------------------------------------------------------------------------------------------------ Invalid input(s): Minco   *UTI with elevated lactic acid. Continue IV ceftriaxone Follow urine culture PT eval  *Diabetes mellitus, uncontrolled.  Likely due to steroid injection for spinal stenosis.  Will continue home dose of NPH and add sliding scale insulin.  Sugar now improved  *Hypertension.  Continue home medications.continue tenormin  *Urinary incontinence  *Coronary artery continue aspirin   * hyperlipdemia-continue Zocor  *Anxiety continue Xanax      Code Status Orders  (From admission, onward)         Start     Ordered   11/09/17 1742  Do not attempt resuscitation (DNR)  Continuous    Question Answer Comment  In the event of cardiac or respiratory ARREST Do not call a "code blue"   In the event of cardiac or respiratory ARREST Do not perform Intubation, CPR, defibrillation or ACLS   In the event of cardiac or respiratory ARREST Use medication by any route, position, wound care, and other measures to relive pain and suffering. May use oxygen, suction and manual treatment of airway obstruction as needed for comfort.      11/09/17 1742        Code Status History    This patient has a current code status but no historical code status.           Consults  non  DVT Prophylaxis  Lovenox   Lab Results  Component  Value Date   PLT 124 (L) 11/10/2017     Time Spent in minutes   43min Greater than 50% of time spent in care coordination and counseling patient regarding the condition and plan of care.   Dustin Flock M.D on 11/10/2017 at 5:15 PM  Between 7am to 6pm - Pager - [phone]  After 6pm go to [email or web address] - Proofreader  [employer]   Office  [phone]

## 2017-11-10 NOTE — Progress Notes (Signed)
PHARMACY - PHYSICIAN COMMUNICATION CRITICAL VALUE ALERT - BLOOD CULTURE IDENTIFICATION (BCID)  Danielle Vang is an 77 y.o. female who presented to [facility] on 11/09/2017 with a chief complaint of chills, weakness  Assessment:  Tmax 101.6, LA 3.2 >> 2.2, UA leuk +, Nitrite +, 2/4 GNR BCID Enterobacteriacaea E. Coli [facility] -  Name of physician (or Provider) Contacted: Arta Silence  Current antibiotics: Ceftriaxone 1g IV daily  Changes to prescribed antibiotics recommended:  Recommendations accepted by provider -- Will switch over to meropenem 1g IV q12h for CrCl 10 - 50 ml/min for possible ESBL E. Coli. Will continue to follow Cx/Sx and adjust therapy as needed.  Results for orders placed or performed during the hospital encounter of 11/09/17  Blood Culture ID Panel (Reflexed) (Collected: 11/09/2017  4:15 PM)  Result Value Ref Range   Enterococcus species NOT DETECTED NOT DETECTED   Listeria monocytogenes NOT DETECTED NOT DETECTED   Staphylococcus species NOT DETECTED NOT DETECTED   Staphylococcus aureus NOT DETECTED NOT DETECTED   Streptococcus species NOT DETECTED NOT DETECTED   Streptococcus agalactiae NOT DETECTED NOT DETECTED   Streptococcus pneumoniae NOT DETECTED NOT DETECTED   Streptococcus pyogenes NOT DETECTED NOT DETECTED   Acinetobacter baumannii NOT DETECTED NOT DETECTED   Enterobacteriaceae species DETECTED (A) NOT DETECTED   Enterobacter cloacae complex NOT DETECTED NOT DETECTED   Escherichia coli DETECTED (A) NOT DETECTED   Klebsiella oxytoca NOT DETECTED NOT DETECTED   Klebsiella pneumoniae NOT DETECTED NOT DETECTED   Proteus species NOT DETECTED NOT DETECTED   Serratia marcescens NOT DETECTED NOT DETECTED   Carbapenem resistance NOT DETECTED NOT DETECTED   Haemophilus influenzae NOT DETECTED NOT DETECTED   Neisseria meningitidis NOT DETECTED NOT DETECTED   Pseudomonas aeruginosa NOT DETECTED NOT DETECTED   Candida albicans NOT DETECTED NOT DETECTED   Candida glabrata NOT DETECTED NOT DETECTED   Candida krusei NOT DETECTED NOT DETECTED   Candida parapsilosis NOT DETECTED NOT DETECTED   Candida tropicalis NOT DETECTED NOT DETECTED   Tobie Lords, PharmD, BCPS Clinical Pharmacist 11/10/2017

## 2017-11-11 ENCOUNTER — Inpatient Hospital Stay
Admit: 2017-11-11 | Discharge: 2017-11-11 | Disposition: A | Discharge status: Home / Self Care | Payer: Medicare HMO | Attending: Internal Medicine | Admitting: Internal Medicine

## 2017-11-11 LAB — GLUCOSE, CAPILLARY
GLUCOSE-CAPILLARY: 78 mg/dL (ref 70–99)
GLUCOSE-CAPILLARY: 134 mg/dL — AB (ref 70–99)
GLUCOSE-CAPILLARY: 118 mg/dL — AB (ref 70–99)
GLUCOSE-CAPILLARY: 178 mg/dL — AB (ref 70–99)
GLUCOSE-CAPILLARY: 179 mg/dL — AB (ref 70–99)
GLUCOSE-CAPILLARY: 167 mg/dL — AB (ref 70–99)
Glucose-Capillary: 121 mg/dL — ABNORMAL HIGH (ref 70–99)

## 2017-11-11 MED ORDER — SODIUM CHLORIDE 0.9 % IV SOLN
1.0000 g | Freq: Three times a day (TID) | INTRAVENOUS | Status: DC
  Administered 2017-11-11 – 2017-11-12 (×3): 1 g via INTRAVENOUS
  Filled 2017-11-11 (×5): qty 1

## 2017-11-11 MED ORDER — SODIUM CHLORIDE 0.9 % IV SOLN
Freq: Once | INTRAVENOUS | Status: AC
  Administered 2017-11-11: 10:00:00 via INTRAVENOUS

## 2017-11-11 MED ORDER — INSULIN ASPART PROT & ASPART (70-30 MIX) 100 UNIT/ML ~~LOC~~ SUSP
10.0000 [IU] | Freq: Every day | SUBCUTANEOUS | Status: DC
  Administered 2017-11-12: 10 [IU] via SUBCUTANEOUS
  Filled 2017-11-11: qty 10

## 2017-11-11 MED ORDER — PERFLUTREN LIPID MICROSPHERE
1.0000 mL | INTRAVENOUS | Status: AC | PRN
  Administered 2017-11-11: 2 mL via INTRAVENOUS
  Filled 2017-11-11: qty 10

## 2017-11-11 MED ORDER — INSULIN ASPART PROT & ASPART (70-30 MIX) 100 UNIT/ML ~~LOC~~ SUSP
10.0000 [IU] | Freq: Every day | SUBCUTANEOUS | Status: DC
  Administered 2017-11-13: 10 [IU] via SUBCUTANEOUS
  Filled 2017-11-11: qty 10

## 2017-11-11 NOTE — Progress Notes (Signed)
PT Cancellation Note  Patient Details Name: Danielle Vang MRN: [id number] DOB: Sep 12, 1940   Cancelled Treatment:    Reason Eval/Treat Not Completed: Other (comment).  Reports she is walking to [place] but just doesn't feel like walk right now.  Asked PT to return at another time.  Will follow up as time and pt allow.   [name] 11/11/2017, 2:22 PM   [name], PT MS Acute Rehab Dept. Number: [place] [facility] [place]

## 2017-11-11 NOTE — Progress Notes (Signed)
[facility] [place] NAME: Danielle Vang    MR#:  [id number]  DATE OF BIRTH:  03-16-1940  SUBJECTIVE:  CHIEF COMPLAINT:   Chief Complaint  Patient presents with  . Chills   Low blood sugar this morning-treated with oral agents, noted hypoxia-we will encourage deep breathing/incentive spirometer while in house, patient without complaint, patient states that she feels better REVIEW OF SYSTEMS:  CONSTITUTIONAL: No fever, fatigue or weakness.  EYES: No blurred or double vision.  EARS, NOSE, AND THROAT: No tinnitus or ear pain.  RESPIRATORY: No cough, shortness of breath, wheezing or hemoptysis.  CARDIOVASCULAR: No chest pain, orthopnea, edema.  GASTROINTESTINAL: No nausea, vomiting, diarrhea or abdominal pain.  GENITOURINARY: No dysuria, hematuria.  ENDOCRINE: No polyuria, nocturia,  HEMATOLOGY: No anemia, easy bruising or bleeding SKIN: No rash or lesion. MUSCULOSKELETAL: No joint pain or arthritis.   NEUROLOGIC: No tingling, numbness, weakness.  PSYCHIATRY: No anxiety or depression.   ROS  DRUG ALLERGIES:  No Known Allergies  VITALS:  Blood pressure (!) 104/43, pulse (!) 55, temperature 98.4 F (36.9 C), temperature source Oral, resp. rate 16, height 5\' 2"  (1.575 m), weight 74.7 kg, SpO2 96 %.  PHYSICAL EXAMINATION:  GENERAL:  77 y.o.-year-old patient lying in the bed with no acute distress.  EYES: Pupils equal, round, reactive to light and accommodation. No scleral icterus. Extraocular muscles intact.  HEENT: Head atraumatic, normocephalic. Oropharynx and nasopharynx clear.  NECK:  Supple, no jugular venous distention. No thyroid enlargement, no tenderness.  LUNGS: Normal breath sounds bilaterally, no wheezing, rales,rhonchi or crepitation. No use of accessory muscles of respiration.  CARDIOVASCULAR: S1, S2 normal. No murmurs, rubs, or gallops.  ABDOMEN: Soft, nontender, nondistended. Bowel sounds present. No organomegaly or mass.   EXTREMITIES: No pedal edema, cyanosis, or clubbing.  NEUROLOGIC: Cranial nerves II through XII are intact. Muscle strength 5/5 in all extremities. Sensation intact. Gait not checked.  PSYCHIATRIC: The patient is alert and oriented x 3.  SKIN: No obvious rash, lesion, or ulcer.   Physical Exam LABORATORY PANEL:   CBC Recent Labs  Lab 11/10/17 0600  WBC 7.1  HGB 11.5*  HCT 34.2*  PLT 124*   ------------------------------------------------------------------------------------------------------------------  Chemistries  Recent Labs  Lab 11/09/17 1605 11/10/17 0600  NA 136 139  K 4.1 4.1  CL 104 106  CO2 22 23  GLUCOSE 312* 264*  BUN 17 14  CREATININE 0.77 0.75  CALCIUM 9.6 8.7*  AST 26  --   ALT 16  --   ALKPHOS 54  --   BILITOT 0.7  --    ------------------------------------------------------------------------------------------------------------------  Cardiac Enzymes No results for input(s): TROPONINI in the last 168 hours. ------------------------------------------------------------------------------------------------------------------  RADIOLOGY:  Dg Chest 2 View  Result Date: 11/09/2017 CLINICAL DATA:  Chills, fever. EXAM: CHEST - 2 VIEW COMPARISON:  Radiographs of August 24, 2016. FINDINGS: The heart size and mediastinal contours are within normal limits. Both lungs are clear. No pneumothorax or pleural effusion is noted. The visualized skeletal structures are unremarkable. IMPRESSION: No active cardiopulmonary disease. Electronically Signed   By: Marijo Conception, M.D.   On: 11/09/2017 16:39    ASSESSMENT AND PLAN:  *Acute E. coli bacteremia Continue meropenem and follow-up on sensitivities  *Acute E. coli urine tract infection Resolving Continue meropenem and follow-up on sensitivities  *Acute hypoxia Aggressive pulmonary toilet with bronchodilator therapy, deep breathing exercises, wean O2 off as tolerated  *Chronic diabetes mellitus type 2 Currently  with hypoglycemia Encourage p.o.  intake, reduce NovoLog, hold glipizide, sliding scale insulin with Accu-Cheks per routine  *Chronic benign essential hypertension  Stable on current regiment   *CAD Continue aspirin   * hyperlipdemia continue Zocor  *Anxiety continue Xanax  All the records are reviewed and case discussed with Care Management/Social Workerr. Management plans discussed with the patient, family and they are in agreement.  CODE STATUS: dnr  TOTAL TIME TAKING CARE OF THIS PATIENT: 35 minutes.     POSSIBLE D/C IN 2 DAYS, DEPENDING ON CLINICAL CONDITION.   Danielle Vang M.D on 11/11/2017   Between 7am to 6pm - Pager - [phone]  After 6pm go to [email or web address] - password [email or web address] [facility]  Office  [phone]  CC: Primary care physician; Marcial Pacas, MD  Note: This dictation was prepared with Dragon dictation along with smaller phrase technology. Any transcriptional errors that result from this process are unintentional.

## 2017-11-11 NOTE — Progress Notes (Signed)
Dr. Jerelyn Charles update on patient status, including patient's blood pressure and heart rate, drop in blood sugar last night, fever last night and need for O2 this morning. Per MD, hold all blood pressure and blood sugar medications at this time. Order received for 1 Liter NS.

## 2017-11-12 LAB — ECHOCARDIOGRAM COMPLETE
Height: 62 in
Weight: 2634.94 oz

## 2017-11-12 LAB — CULTURE, BLOOD (ROUTINE X 2)

## 2017-11-12 LAB — GLUCOSE, CAPILLARY
GLUCOSE-CAPILLARY: 183 mg/dL — AB (ref 70–99)
GLUCOSE-CAPILLARY: 192 mg/dL — AB (ref 70–99)
Glucose-Capillary: 188 mg/dL — ABNORMAL HIGH (ref 70–99)
Glucose-Capillary: 270 mg/dL — ABNORMAL HIGH (ref 70–99)
Glucose-Capillary: 195 mg/dL — ABNORMAL HIGH (ref 70–99)

## 2017-11-12 LAB — URINE CULTURE

## 2017-11-12 MED ORDER — CEFAZOLIN SODIUM-DEXTROSE 2-4 GM/100ML-% IV SOLN
2.0000 g | Freq: Three times a day (TID) | INTRAVENOUS | Status: DC
  Administered 2017-11-12 – 2017-11-13 (×3): 2 g via INTRAVENOUS
  Filled 2017-11-12 (×5): qty 100

## 2017-11-12 MED ORDER — SODIUM CHLORIDE 0.9 % IV SOLN
INTRAVENOUS | Status: DC | PRN
  Administered 2017-11-12: 250 mL via INTRAVENOUS

## 2017-11-12 NOTE — Progress Notes (Signed)
[facility] [place] NAME: Danielle Vang    MR#:  [id number]  DATE OF BIRTH:  01/10/1941  SUBJECTIVE:  CHIEF COMPLAINT:   Chief Complaint  Patient presents with  . Chills  Patient feeling better, continued fevers noted, cultures noted-change antibiotics to IV Ancef, discontinue meropenem, physical therapy to see REVIEW OF SYSTEMS:  CONSTITUTIONAL: No fever, fatigue or weakness.  EYES: No blurred or double vision.  EARS, NOSE, AND THROAT: No tinnitus or ear pain.  RESPIRATORY: No cough, shortness of breath, wheezing or hemoptysis.  CARDIOVASCULAR: No chest pain, orthopnea, edema.  GASTROINTESTINAL: No nausea, vomiting, diarrhea or abdominal pain.  GENITOURINARY: No dysuria, hematuria.  ENDOCRINE: No polyuria, nocturia,  HEMATOLOGY: No anemia, easy bruising or bleeding SKIN: No rash or lesion. MUSCULOSKELETAL: No joint pain or arthritis.   NEUROLOGIC: No tingling, numbness, weakness.  PSYCHIATRY: No anxiety or depression.   ROS  DRUG ALLERGIES:  No Known Allergies  VITALS:  Blood pressure (!) 135/59, pulse 67, temperature 99.8 F (37.7 C), temperature source Oral, resp. rate 16, height 5\' 2"  (1.575 m), weight 74.7 kg, SpO2 96 %.  PHYSICAL EXAMINATION:  GENERAL:  77 y.o.-year-old patient lying in the bed with no acute distress.  EYES: Pupils equal, round, reactive to light and accommodation. No scleral icterus. Extraocular muscles intact.  HEENT: Head atraumatic, normocephalic. Oropharynx and nasopharynx clear.  NECK:  Supple, no jugular venous distention. No thyroid enlargement, no tenderness.  LUNGS: Normal breath sounds bilaterally, no wheezing, rales,rhonchi or crepitation. No use of accessory muscles of respiration.  CARDIOVASCULAR: S1, S2 normal. No murmurs, rubs, or gallops.  ABDOMEN: Soft, nontender, nondistended. Bowel sounds present. No organomegaly or mass.  EXTREMITIES: No pedal edema, cyanosis, or clubbing.  NEUROLOGIC:  Cranial nerves II through XII are intact. Muscle strength 5/5 in all extremities. Sensation intact. Gait not checked.  PSYCHIATRIC: The patient is alert and oriented x 3.  SKIN: No obvious rash, lesion, or ulcer.   Physical Exam LABORATORY PANEL:   CBC Recent Labs  Lab 11/10/17 0600  WBC 7.1  HGB 11.5*  HCT 34.2*  PLT 124*   ------------------------------------------------------------------------------------------------------------------  Chemistries  Recent Labs  Lab 11/09/17 1605 11/10/17 0600  NA 136 139  K 4.1 4.1  CL 104 106  CO2 22 23  GLUCOSE 312* 264*  BUN 17 14  CREATININE 0.77 0.75  CALCIUM 9.6 8.7*  AST 26  --   ALT 16  --   ALKPHOS 54  --   BILITOT 0.7  --    ------------------------------------------------------------------------------------------------------------------  Cardiac Enzymes No results for input(s): TROPONINI in the last 168 hours. ------------------------------------------------------------------------------------------------------------------  RADIOLOGY:  No results found.  ASSESSMENT AND PLAN:  *Acute E. coli bacteremia Treated initially with meropenem-changed to IV Ancef given sensitivities to complete 5-7-day course   *Acute E. coli urine tract infection Resolving Plan of care per above  *Acute hypoxia Stable Continue  BTs prn, Pulmicort twice daily, incentive spirometer/deep breathing exercises, wean O2 off as tolerated  *Chronic diabetes mellitus type 2 Stable on current regiment  Continue to encourage p.o. intake, NovoLog reduced given hypoglycemia on 11/11/2017, continue to hold  glipizide, continue sliding scale insulin with Accu-Cheks per routine  *Chronic benign essential hypertension  Stable on current regiment   *CAD Continue aspirin   * hyperlipdemia continue Zocor  *Anxiety continue Xanax  Disposition home in 1 to 2 days barring any complications  All the records are reviewed and case discussed with  Care Management/Social Workerr.  Management plans discussed with the patient, family and they are in agreement.  CODE STATUS: dnr  TOTAL TIME TAKING CARE OF THIS PATIENT: 35 minutes.     POSSIBLE D/C IN 2 DAYS, DEPENDING ON CLINICAL CONDITION.   Danielle Vang M.D on 11/12/2017   Between 7am to 6pm - Pager - [phone]  After 6pm go to [email or web address] - password [email or web address] [facility]  Office  [phone]  CC: Primary care physician; Danielle Pacas, MD  Note: This dictation was prepared with Dragon dictation along with smaller phrase technology. Any transcriptional errors that result from this process are unintentional.

## 2017-11-12 NOTE — Evaluation (Signed)
Physical Therapy Evaluation Patient Details Name: Danielle Vang MRN: [id number] DOB: 12-04-1940 Today's Date: 11/12/2017   History of Present Illness  77 yo female with onset of UTI, elevated lactic acid and hypoxia, and demonstrating weakness and new requirement for O2 use.  Pt is still working, independently living and driving.  PMHx:  DM, HTN, CAD, anxiety, skin CA,   Clinical Impression  Pt is up to walk with HHA and on room air, with drop from 97% to 88% with mobility.  Her plan is to progress with acute therapy and will be home with her daughter to give assistance once discharged.  Follow for strengthening and monitoring of sats, to see if O2 can be eliminated and ck other vitals as she continues to recover from her infection.    Follow Up Recommendations Home health PT;Supervision for mobility/OOB    Equipment Recommendations  Rolling walker with 5" wheels(may be fine with rollator)    Recommendations for Other Services       Precautions / Restrictions Precautions Precautions: Fall(telemetry, ck O2 sats with all mobility) Restrictions Weight Bearing Restrictions: No      Mobility  Bed Mobility Overal bed mobility: Needs Assistance Bed Mobility: Supine to Sit;Sit to Supine     Supine to sit: Min assist Sit to supine: Min assist   General bed mobility comments: min assist to lift up trunk and min to return LE's to bed  Transfers Overall transfer level: Needs assistance Equipment used: 1 person hand held assist Transfers: Sit to/from Stand Sit to Stand: Min guard         General transfer comment: pt is using bed rail and cues for standing as well as in BR  Ambulation/Gait Ambulation/Gait assistance: Min guard Gait Distance (Feet): 35 Feet Assistive device: 1 person hand held assist   Gait velocity: reduced Gait velocity interpretation: <1.31 ft/sec, indicative of household ambulator General Gait Details: laterally shifts with some steps, can walk  but is likely to fall unless using RW  Stairs            Wheelchair Mobility    Modified Rankin (Stroke Patients Only)       Balance Overall balance assessment: Needs assistance Sitting-balance support: Feet supported Sitting balance-Leahy Scale: Good     Standing balance support: Single extremity supported Standing balance-Leahy Scale: Fair                               Pertinent Vitals/Pain Pain Assessment: No/denies pain    Home Living Family/patient expects to be discharged to:: Private residence Living Arrangements: Alone Available Help at Discharge: Family;Available PRN/intermittently Type of Home: House Home Access: Stairs to enter     Home Layout: One level Home Equipment: None Additional Comments: has been working and not needing any devices    Prior Function Level of Independence: Independent               Hand Dominance        Extremity/Trunk Assessment   Upper Extremity Assessment Upper Extremity Assessment: Overall WFL for tasks assessed    Lower Extremity Assessment Lower Extremity Assessment: Overall WFL for tasks assessed    Cervical / Trunk Assessment Cervical / Trunk Assessment: Normal  Communication   Communication: No difficulties  Cognition Arousal/Alertness: Awake/alert Behavior During Therapy: WFL for tasks assessed/performed Overall Cognitive Status: Within Functional Limits for tasks assessed  General Comments General comments (skin integrity, edema, etc.): removed O2 for gait and noted her sat was 97% at rest but dropped to 88% with gait    Exercises     Assessment/Plan    PT Assessment Patient needs continued PT services  PT Problem List Decreased strength;Decreased range of motion;Decreased activity tolerance;Decreased balance;Decreased mobility;Decreased coordination;Decreased safety awareness;Cardiopulmonary status limiting activity        PT Treatment Interventions DME instruction;Gait training;Stair training;Functional mobility training;Therapeutic activities;Therapeutic exercise;Balance training;Neuromuscular re-education;Patient/family education    PT Goals (Current goals can be found in the Care Plan section)  Acute Rehab PT Goals Patient Stated Goal: to get back to usual  PT Goal Formulation: With patient Time For Goal Achievement: 11/19/17 Potential to Achieve Goals: Good    Frequency Min 2X/week   Barriers to discharge Inaccessible home environment home with stairs to enter house    Co-evaluation               AM-PAC PT "6 Clicks" Daily Activity  Outcome Measure Difficulty turning over in bed (including adjusting bedclothes, sheets and blankets)?: A Little Difficulty moving from lying on back to sitting on the side of the bed? : Unable Difficulty sitting down on and standing up from a chair with arms (e.g., wheelchair, bedside commode, etc,.)?: Unable Help needed moving to and from a bed to chair (including a wheelchair)?: A Little Help needed walking in hospital room?: A Little Help needed climbing 3-5 steps with a railing? : A Little 6 Click Score: 14    End of Session Equipment Utilized During Treatment: Gait belt;Oxygen Activity Tolerance: Treatment limited secondary to medical complications (Comment) Patient left: in bed;with call bell/phone within reach;with bed alarm set;with nursing/sitter in room Nurse Communication: Mobility status(O2 sats were dropping) PT Visit Diagnosis: Unsteadiness on feet (R26.81);Muscle weakness (generalized) (M62.81);Dizziness and giddiness (R42)    Time: 9563-8756 PT Time Calculation (min) (ACUTE ONLY): 23 min   Charges:   PT Evaluation $PT Eval Moderate Complexity: 1 Mod PT Treatments $Gait Training: 8-22 mins        [name] 11/12/2017, 2:57 PM   [name], PT MS Acute Rehab Dept. Number: [place] [facility] [place]

## 2017-11-13 LAB — BLOOD CULTURE ID PANEL (REFLEXED)
Acinetobacter baumannii: NOT DETECTED
CARBAPENEM RESISTANCE: NOT DETECTED
Candida albicans: NOT DETECTED
Candida glabrata: NOT DETECTED
Candida krusei: NOT DETECTED
Candida parapsilosis: NOT DETECTED
Candida tropicalis: NOT DETECTED
ENTEROBACTERIACEAE SPECIES: DETECTED — AB
ENTEROCOCCUS SPECIES: NOT DETECTED
Enterobacter cloacae complex: NOT DETECTED
Escherichia coli: DETECTED — AB
HAEMOPHILUS INFLUENZAE: NOT DETECTED
KLEBSIELLA OXYTOCA: NOT DETECTED
Klebsiella pneumoniae: NOT DETECTED
LISTERIA MONOCYTOGENES: NOT DETECTED
NEISSERIA MENINGITIDIS: NOT DETECTED
PSEUDOMONAS AERUGINOSA: NOT DETECTED
Proteus species: NOT DETECTED
STAPHYLOCOCCUS AUREUS BCID: NOT DETECTED
STREPTOCOCCUS PNEUMONIAE: NOT DETECTED
STREPTOCOCCUS SPECIES: NOT DETECTED
Serratia marcescens: NOT DETECTED
Staphylococcus species: NOT DETECTED
Streptococcus agalactiae: NOT DETECTED
Streptococcus pyogenes: NOT DETECTED

## 2017-11-13 LAB — GLUCOSE, CAPILLARY
Glucose-Capillary: 180 mg/dL — ABNORMAL HIGH (ref 70–99)
Glucose-Capillary: 178 mg/dL — ABNORMAL HIGH (ref 70–99)

## 2017-11-13 MED ORDER — CEPHALEXIN 500 MG PO CAPS: 500.0000 mg | ORAL_CAPSULE | Freq: Three times a day (TID) | ORAL | 0 refills | Status: AC

## 2017-11-13 NOTE — Care Management Important Message (Signed)
Copy of signed IM left with patient in room.  

## 2017-11-13 NOTE — Discharge Summary (Signed)
[facility] [place] NAME: Danielle Vang    MR#:  [id number]  DATE OF BIRTH:  [birth year]  DATE OF ADMISSION:  11/09/2017 ADMITTING PHYSICIAN: Hillary Bow, MD  DATE OF DISCHARGE: No discharge date for patient encounter.  PRIMARY CARE PHYSICIAN: Ringel, Fanny Dance, MD    ADMISSION DIAGNOSIS:  Sepsis, due to unspecified organism [A41.9] Urinary tract infection with hematuria, site unspecified [N39.0, R31.9]  DISCHARGE DIAGNOSIS:  Active Problems:   UTI (urinary tract infection)   SECONDARY DIAGNOSIS:   Past Medical History:  Diagnosis Date  . Anxiety   . Cancer ([place])    skin ca  . Coronary artery disease   . Diabetes mellitus without complication ([place])   . Hypertension     HOSPITAL COURSE:  *Acute E. coli bacteremia Resolving Treated initially with meropenem-changed to IV Ancef given sensitivities to complete 5-7-day course   *Acute E. coli urine tract infection Resolving Plan of care per above  *Acute hypoxia Resolved  Treated with breathing treatments as needed, successfully weaned off oxygen   *Chronic diabetes mellitus type 2 Stable on current regiment  Continue to encourage p.o. intake, NovoLog reduced given hypoglycemia on 11/11/2017, held glipizide while in house, continued sliding scale insulin with Accu-Cheks per routine  *Chronic benign essential hypertension  Stable on current regiment   *CAD Continued aspirin  * hyperlipdemia continued Zocor  *Anxiety continued Xanax   DISCHARGE CONDITIONS:   stable  CONSULTS OBTAINED:    DRUG ALLERGIES:  No Known Allergies  DISCHARGE MEDICATIONS:   Allergies as of 11/13/2017   No Known Allergies     Medication List    TAKE these medications   ALPRAZolam 0.5 MG tablet Commonly known as:  XANAX Take 0.5 mg by mouth at bedtime as needed for anxiety.   aspirin EC 325 MG tablet Take 325 mg by mouth daily.   atenolol 50 MG  tablet Commonly known as:  TENORMIN Take 50 mg by mouth daily.   cephALEXin 500 MG capsule Commonly known as:  KEFLEX Take 1 capsule (500 mg total) by mouth 3 (three) times daily for 10 days.   cholecalciferol 1000 units tablet Commonly known as:  VITAMIN D Take 4,000 Units by mouth daily.   glipiZIDE 10 MG tablet Commonly known as:  GLUCOTROL Take 10 mg by mouth daily before breakfast.   hydrochlorothiazide 12.5 MG capsule Commonly known as:  MICROZIDE Take 1 capsule by mouth daily.   ibuprofen 400 MG tablet Commonly known as:  ADVIL,MOTRIN Take 1 tablet (400 mg total) by mouth every 6 (six) hours as needed.   lisinopril 20 MG tablet Commonly known as:  PRINIVIL,ZESTRIL Take 20 mg by mouth daily.   meloxicam 7.5 MG tablet Commonly known as:  MOBIC Take 1 tablet by mouth daily.   metFORMIN 1000 MG tablet Commonly known as:  GLUCOPHAGE Take 1,000 mg by mouth 2 (two) times daily with a meal.   methocarbamol 750 MG tablet Commonly known as:  ROBAXIN Take 1 tablet (750 mg total) by mouth every 4 (four) hours.   NOVOLIN 70/30 (70-30) 100 UNIT/ML injection Generic drug:  insulin NPH-regular Human Inject 14-20 Units into the skin 2 (two) times daily. 14UNITS-AM,  20UNITS-PM   sertraline 50 MG tablet Commonly known as:  ZOLOFT Take 1 tablet by mouth daily.   simvastatin 20 MG tablet Commonly known as:  ZOCOR Take 20 mg by mouth daily.        DISCHARGE INSTRUCTIONS:  If  you experience worsening of your admission symptoms, develop shortness of breath, life threatening emergency, suicidal or homicidal thoughts you must seek medical attention immediately by calling 911 or calling your MD immediately  if symptoms less severe.  You Must read complete instructions/literature along with all the possible adverse reactions/side effects for all the Medicines you take and that have been prescribed to you. Take any new Medicines after you have completely understood and accept  all the possible adverse reactions/side effects.   Please note  You were cared for by a hospitalist during your hospital stay. If you have any questions about your discharge medications or the care you received while you were in the hospital after you are discharged, you can call the unit and asked to speak with the hospitalist on call if the hospitalist that took care of you is not available. Once you are discharged, your primary care physician will handle any further medical issues. Please note that NO REFILLS for any discharge medications will be authorized once you are discharged, as it is imperative that you return to your primary care physician (or establish a relationship with a primary care physician if you do not have one) for your aftercare needs so that they can reassess your need for medications and monitor your lab values.    Today   CHIEF COMPLAINT:   Chief Complaint  Patient presents with  . Chills    HISTORY OF PRESENT ILLNESS:  77 y.o. female with a known history of diabetes, hypertension, recurrent UTIs presents to the hospital due to acute onset of chills and fever at work.  Patient felt extremely weak and sweaty.  Arrived to the emergency room was found to have fever of 101.5.  Found to have UTI and elevated lactic acid of 3.2.  Patient is being admitted for UTI with elevated lactic acid.  Patient feels fatigued.  No abdominal pain or vomiting.  No recent antibiotic use.  Does get recurrent UTIs.  Today her blood sugar is significantly elevated at 312.  She has received a steroid injection for spinal stenosis few days back.   VITAL SIGNS:  Blood pressure 140/61, pulse 66, temperature 98.5 F (36.9 C), temperature source Oral, resp. rate (!) 22, height 5\' 2"  (1.575 m), weight 74.7 kg, SpO2 96 %.  I/O:    Intake/Output Summary (Last 24 hours) at 11/13/2017 1108 Last data filed at 11/13/2017 0525 Gross per 24 hour  Intake 103.6 ml  Output 1300 ml  Net -1196.4 ml     PHYSICAL EXAMINATION:  GENERAL:  77 y.o.-year-old patient lying in the bed with no acute distress.  EYES: Pupils equal, round, reactive to light and accommodation. No scleral icterus. Extraocular muscles intact.  HEENT: Head atraumatic, normocephalic. Oropharynx and nasopharynx clear.  NECK:  Supple, no jugular venous distention. No thyroid enlargement, no tenderness.  LUNGS: Normal breath sounds bilaterally, no wheezing, rales,rhonchi or crepitation. No use of accessory muscles of respiration.  CARDIOVASCULAR: S1, S2 normal. No murmurs, rubs, or gallops.  ABDOMEN: Soft, non-tender, non-distended. Bowel sounds present. No organomegaly or mass.  EXTREMITIES: No pedal edema, cyanosis, or clubbing.  NEUROLOGIC: Cranial nerves II through XII are intact. Muscle strength 5/5 in all extremities. Sensation intact. Gait not checked.  PSYCHIATRIC: The patient is alert and oriented x 3.  SKIN: No obvious rash, lesion, or ulcer.   DATA REVIEW:   CBC Recent Labs  Lab 11/10/17 0600  WBC 7.1  HGB 11.5*  HCT 34.2*  PLT 124*  Chemistries  Recent Labs  Lab 11/09/17 1605 11/10/17 0600  NA 136 139  K 4.1 4.1  CL 104 106  CO2 22 23  GLUCOSE 312* 264*  BUN 17 14  CREATININE 0.77 0.75  CALCIUM 9.6 8.7*  AST 26  --   ALT 16  --   ALKPHOS 54  --   BILITOT 0.7  --     Cardiac Enzymes No results for input(s): TROPONINI in the last 168 hours.  Microbiology Results  Results for orders placed or performed during the hospital encounter of 11/09/17  Blood culture (routine x 2)     Status: Abnormal   Collection Time: 11/09/17  4:15 PM  Result Value Ref Range Status   Specimen Description   Final    BLOOD BLOOD LEFT FOREARM Performed at [facility], [address] [place], [place] [address]    Special Requests   Final    BOTTLES DRAWN AEROBIC AND ANAEROBIC Blood Culture results may not be optimal due to an excessive volume of blood received in culture bottles Performed at  [facility], [address], [place], [place] [address]    Culture  Setup Time   Final    GRAM NEGATIVE RODS IN BOTH AEROBIC AND ANAEROBIC BOTTLES CRITICAL VALUE NOTED.  VALUE IS CONSISTENT WITH PREVIOUSLY REPORTED AND CALLED VALUE. Performed at [facility], [address] [place], [place] [address]    Culture (A)  Final    ESCHERICHIA COLI SUSCEPTIBILITIES PERFORMED ON PREVIOUS CULTURE WITHIN THE LAST 5 DAYS. Performed at [facility], [place] [address], [place], [place] [address]    Report Status 11/12/2017 FINAL  Final  Blood culture (routine x 2)     Status: Abnormal   Collection Time: 11/09/17  4:15 PM  Result Value Ref Range Status   Specimen Description   Final    BLOOD RIGHT ANTECUBITAL Performed at [facility], [address] [place], [place] [address]    Special Requests   Final    BOTTLES DRAWN AEROBIC AND ANAEROBIC Blood Culture results may not be optimal due to an excessive volume of blood received in culture bottles Performed at [facility], [address], [place], [place] [address]    Culture  Setup Time   Final    GRAM NEGATIVE RODS IN BOTH AEROBIC AND ANAEROBIC BOTTLES CRITICAL RESULT CALLED TO, READ BACK BY AND VERIFIED WITH: C/DAVID BESANTI @0420  11/10/17 FLC Performed at [facility], [address] [place], [place] [address]    Culture ESCHERICHIA COLI (A)  Final   Report Status 11/12/2017 FINAL  Final   Organism ID, Bacteria ESCHERICHIA COLI  Final      Susceptibility   Escherichia coli - MIC*    AMPICILLIN <=2 SENSITIVE Sensitive     CEFAZOLIN <=4 SENSITIVE Sensitive     CEFEPIME <=1 SENSITIVE Sensitive     CEFTAZIDIME <=1 SENSITIVE Sensitive     CEFTRIAXONE <=1 SENSITIVE Sensitive     CIPROFLOXACIN <=0.25 SENSITIVE Sensitive     GENTAMICIN <=1 SENSITIVE Sensitive     IMIPENEM <=0.25 SENSITIVE Sensitive     TRIMETH/SULFA >=320 RESISTANT Resistant     AMPICILLIN/SULBACTAM <=2 SENSITIVE  Sensitive     PIP/TAZO <=4 SENSITIVE Sensitive     Extended ESBL NEGATIVE Sensitive     * ESCHERICHIA COLI  Urine Culture     Status: Abnormal   Collection Time: 11/09/17  4:15 PM  Result Value Ref Range Status   Specimen  Description   Final    URINE, RANDOM Performed at [facility], [address] [place], [place] [address]    Special Requests   Final    NONE Performed at [facility], [address] [place], [place] [address]    Culture >=100,000 COLONIES/mL ESCHERICHIA COLI (A)  Final   Report Status 11/12/2017 FINAL  Final   Organism ID, Bacteria ESCHERICHIA COLI (A)  Final      Susceptibility   Escherichia coli - MIC*    AMPICILLIN <=2 SENSITIVE Sensitive     CEFAZOLIN <=4 SENSITIVE Sensitive     CEFTRIAXONE <=1 SENSITIVE Sensitive     CIPROFLOXACIN <=0.25 SENSITIVE Sensitive     GENTAMICIN <=1 SENSITIVE Sensitive     IMIPENEM <=0.25 SENSITIVE Sensitive     NITROFURANTOIN <=16 SENSITIVE Sensitive     TRIMETH/SULFA >=320 RESISTANT Resistant     AMPICILLIN/SULBACTAM <=2 SENSITIVE Sensitive     PIP/TAZO <=4 SENSITIVE Sensitive     Extended ESBL NEGATIVE Sensitive     * >=100,000 COLONIES/mL ESCHERICHIA COLI  Blood Culture ID Panel (Reflexed)     Status: Abnormal   Collection Time: 11/09/17  4:15 PM  Result Value Ref Range Status   Enterococcus species NOT DETECTED NOT DETECTED Final   Listeria monocytogenes NOT DETECTED NOT DETECTED Final   Staphylococcus species NOT DETECTED NOT DETECTED Final   Staphylococcus aureus NOT DETECTED NOT DETECTED Final   Streptococcus species NOT DETECTED NOT DETECTED Final   Streptococcus agalactiae NOT DETECTED NOT DETECTED Final   Streptococcus pneumoniae NOT DETECTED NOT DETECTED Final   Streptococcus pyogenes NOT DETECTED NOT DETECTED Final   Acinetobacter baumannii NOT DETECTED NOT DETECTED Final   Enterobacteriaceae species DETECTED (A) NOT DETECTED Final    Comment: Enterobacteriaceae represent a large family  of gram-negative bacteria, not a single organism. C/DAVID BESANTI @0420  11/10/17 FLC    Enterobacter cloacae complex NOT DETECTED NOT DETECTED Final   Escherichia coli DETECTED (A) NOT DETECTED Final    Comment: C/DAVID BESANTI @0420  11/10/17 FLC   Klebsiella oxytoca NOT DETECTED NOT DETECTED Final   Klebsiella pneumoniae NOT DETECTED NOT DETECTED Final   Proteus species NOT DETECTED NOT DETECTED Final   Serratia marcescens NOT DETECTED NOT DETECTED Final   Carbapenem resistance NOT DETECTED NOT DETECTED Final   Haemophilus influenzae NOT DETECTED NOT DETECTED Final   Neisseria meningitidis NOT DETECTED NOT DETECTED Final   Pseudomonas aeruginosa NOT DETECTED NOT DETECTED Final   Candida albicans NOT DETECTED NOT DETECTED Final   Candida glabrata NOT DETECTED NOT DETECTED Final   Candida krusei NOT DETECTED NOT DETECTED Final   Candida parapsilosis NOT DETECTED NOT DETECTED Final   Candida tropicalis NOT DETECTED NOT DETECTED Final    Comment: Performed at [facility], [address], [place], [place] [address]    RADIOLOGY:  No results found.  EKG:   Orders placed or performed during the hospital encounter of 11/09/17  . ED EKG  . ED EKG  . EKG 12-Lead  . EKG 12-Lead  . EKG      Management plans discussed with the patient, family and they are in agreement.  CODE STATUS:     Code Status Orders  (From admission, onward)         Start     Ordered   11/09/17 1742  Do not attempt resuscitation (DNR)  Continuous    Question Answer Comment  In the event of cardiac or respiratory ARREST Do not call a "code  blue"   In the event of cardiac or respiratory ARREST Do not perform Intubation, CPR, defibrillation or ACLS   In the event of cardiac or respiratory ARREST Use medication by any route, position, wound care, and other measures to relive pain and suffering. May use oxygen, suction and manual treatment of airway obstruction as needed for comfort.      11/09/17  1742        Code Status History    This patient has a current code status but no historical code status.      TOTAL TIME TAKING CARE OF THIS PATIENT: 35 minutes.    Avel Peace Jashawn Floyd M.D on 11/13/2017 at 11:08 AM  Between 7am to 6pm - Pager - [phone]  After 6pm go to [email or web address] - password [email or web address] [facility]  Office  [phone]  CC: Primary care physician; Marcial Pacas, MD   Note: This dictation was prepared with Dragon dictation along with smaller phrase technology. Any transcriptional errors that result from this process are unintentional.

## 2017-11-13 NOTE — Care Management Note (Signed)
Case Management Note  Patient Details  Name: Danielle Vang MRN: [id number] Date of Birth: January 24, 1941   Patient admitted with UTI.  As baseline patient lives at home alone.  Patient states that at discharge her daughter Danielle Vang will be staying with her.  PCP Ringel. Patient Denies issues obtaining medications. Patient states that at baseline she is able to drive herself, however her daughter will be providing transportation after discharge. Patient has been weaned to RA, and will not require O2 at discharge.  PT has assessed patient and recommeneds home health.  Patient agreeable to home health services, and states that she does not have a preference of agency.  [facility] is unable to accept the case due to the patients address.  Referral made to [facility] with [facility]. RW delivered to room by [facility] with [facility].  RNCM signing off.   Subjective/Objective:                    Action/Plan:   Expected Discharge Date:  11/13/17               Expected Discharge Plan:  [place]  In-House Referral:     Discharge planning Services  CM Consult  Post Acute Care Choice:  Home Health, Durable Medical Equipment Choice offered to:  Patient  DME Arranged:  [facility] rolling DME Agency:  [place]:  PT, Social Work [employer] Agency:  [place]  Status of Service:  Completed, signed off  If discussed at [employer], dates discussed:    Additional Comments:  Beverly Sessions, RN 11/13/2017, 2:00 PM

## 2017-11-13 NOTE — Progress Notes (Signed)
Physical Therapy Treatment Patient Details Name: Danielle Vang MRN: [id number] DOB: May 20, 1940 Today's Date: 11/13/2017    History of Present Illness 77 yo female with onset of UTI, elevated lactic acid and hypoxia, and demonstrating weakness and new requirement for O2 use.  Pt is still working, independently living and driving.  PMHx:  DM, HTN, CAD, anxiety, skin CA,     PT Comments    Pt is expecting to go home imminently and is progressing nicely with gait and HEP.  Her daughter is in attendance to see the HEP and is going to take her with her.  Pt is going to continue acutely if for some reason her dc is held, but otherwise will follow with HHPT to get her back to mod I gait and transfers.  Pt is expecting to get to go home and then back to work.     Follow Up Recommendations  Home health PT;Supervision for mobility/OOB     Equipment Recommendations       Recommendations for Other Services       Precautions / Restrictions Precautions Precautions: Fall(telemetry) Restrictions Weight Bearing Restrictions: No    Mobility  Bed Mobility Overal bed mobility: Modified Independent Bed Mobility: Supine to Sit     Supine to sit: Modified independent (Device/Increase time)        Transfers Overall transfer level: Modified independent Equipment used: Rolling walker (2 wheeled) Transfers: Sit to/from Stand Sit to Stand: Modified independent (Device/Increase time)            Ambulation/Gait Ambulation/Gait assistance: Min guard(only for safety) Gait Distance (Feet): 200 Feet(60 + 140) Assistive device: Rolling walker (2 wheeled) Gait Pattern/deviations: Step-through pattern;Decreased stride length;Wide base of support Gait velocity: reduced   General Gait Details: controlled gait pattern with RW   Stairs             Wheelchair Mobility    Modified Rankin (Stroke Patients Only)       Balance Overall balance assessment: Needs  assistance Sitting-balance support: Feet supported Sitting balance-Leahy Scale: Good     Standing balance support: Bilateral upper extremity supported;During functional activity Standing balance-Leahy Scale: Fair                              Cognition Arousal/Alertness: Awake/alert Behavior During Therapy: WFL for tasks assessed/performed Overall Cognitive Status: Within Functional Limits for tasks assessed                                        Exercises General Exercises - Lower Extremity Ankle Circles/Pumps: AROM;Both;5 reps Gluteal Sets: AROM;Both;10 reps;Standing Long Arc Quad: AROM;Both;20 reps    General Comments General comments (skin integrity, edema, etc.): did not have O2 ordered today for use      Pertinent Vitals/Pain Pain Assessment: No/denies pain    Home Living                      Prior Function            PT Goals (current goals can now be found in the care plan section) Acute Rehab PT Goals Patient Stated Goal: get home Progress towards PT goals: Progressing toward goals    Frequency    Min 2X/week      PT Plan Current plan remains appropriate    Co-evaluation  AM-PAC PT "6 Clicks" Daily Activity  Outcome Measure  Difficulty turning over in bed (including adjusting bedclothes, sheets and blankets)?: None Difficulty moving from lying on back to sitting on the side of the bed? : A Little Difficulty sitting down on and standing up from a chair with arms (e.g., wheelchair, bedside commode, etc,.)?: A Little Help needed moving to and from a bed to chair (including a wheelchair)?: A Little Help needed walking in hospital room?: A Little Help needed climbing 3-5 steps with a railing? : A Little 6 Click Score: 19    End of Session Equipment Utilized During Treatment: Gait belt Activity Tolerance: Patient tolerated treatment well Patient left: in bed;with call bell/phone within  reach;with family/visitor present Nurse Communication: Mobility status PT Visit Diagnosis: Unsteadiness on feet (R26.81);Muscle weakness (generalized) (M62.81);Dizziness and giddiness (R42)     Time: 6578-4696 PT Time Calculation (min) (ACUTE ONLY): 27 min  Charges:  $Gait Training: 8-22 mins $Therapeutic Exercise: 8-22 mins                    [name] 11/13/2017, 1:08 PM   [name], PT MS Acute Rehab Dept. Number: [place] and [place]

## 2017-11-16 ENCOUNTER — Telehealth: Payer: Self-pay

## 2017-11-16 NOTE — Telephone Encounter (Signed)
Flagged on EMMI report for not reading discharge papers.  First attempt to reach patient made 11/16/17 at 2:13pm, however unable to reach patient.  Left voicemail encouraging callback. Will attempt at later time.

## 2017-11-17 NOTE — Telephone Encounter (Signed)
Second attempt to reach made on 11/17/17 at 2:02pm, however unable to reach.  Left another voicemail encouraging callback for any questions or concerns.

## 2017-11-29 ENCOUNTER — Ambulatory Visit: Payer: Medicare HMO | Admitting: Urology

## 2017-11-29 ENCOUNTER — Encounter: Payer: Self-pay | Admitting: Urology

## 2017-11-29 VITALS — BP 108/62 | HR 66 | Ht 62.0 in | Wt 154.7 lb

## 2017-11-29 DIAGNOSIS — A4151 Sepsis due to Escherichia coli [E. coli]: Secondary | ICD-10-CM

## 2017-11-29 DIAGNOSIS — N952 Postmenopausal atrophic vaginitis: Secondary | ICD-10-CM | POA: Diagnosis not present

## 2017-11-29 DIAGNOSIS — H2510 Age-related nuclear cataract, unspecified eye: Secondary | ICD-10-CM | POA: Insufficient documentation

## 2017-11-29 DIAGNOSIS — M542 Cervicalgia: Secondary | ICD-10-CM | POA: Insufficient documentation

## 2017-11-29 DIAGNOSIS — E785 Hyperlipidemia, unspecified: Secondary | ICD-10-CM | POA: Insufficient documentation

## 2017-11-29 DIAGNOSIS — M546 Pain in thoracic spine: Secondary | ICD-10-CM | POA: Insufficient documentation

## 2017-11-29 DIAGNOSIS — R3915 Urgency of urination: Secondary | ICD-10-CM | POA: Diagnosis not present

## 2017-11-29 DIAGNOSIS — I10 Essential (primary) hypertension: Secondary | ICD-10-CM | POA: Insufficient documentation

## 2017-11-29 DIAGNOSIS — M5030 Other cervical disc degeneration, unspecified cervical region: Secondary | ICD-10-CM | POA: Insufficient documentation

## 2017-11-29 DIAGNOSIS — H8110 Benign paroxysmal vertigo, unspecified ear: Secondary | ICD-10-CM | POA: Insufficient documentation

## 2017-11-29 DIAGNOSIS — I2510 Atherosclerotic heart disease of native coronary artery without angina pectoris: Secondary | ICD-10-CM | POA: Insufficient documentation

## 2017-11-29 DIAGNOSIS — M810 Age-related osteoporosis without current pathological fracture: Secondary | ICD-10-CM | POA: Insufficient documentation

## 2017-11-29 LAB — URINALYSIS, COMPLETE
BILIRUBIN UA: NEGATIVE
Glucose, UA: NEGATIVE
KETONES UA: NEGATIVE
NITRITE UA: POSITIVE — AB
PH UA: 5 (ref 5.0–7.5)
Protein, UA: NEGATIVE
RBC UA: NEGATIVE
SPEC GRAV UA: 1.02 (ref 1.005–1.030)
UUROB: 0.2 mg/dL (ref 0.2–1.0)

## 2017-11-29 LAB — MICROSCOPIC EXAMINATION
EPITHELIAL CELLS (NON RENAL): NONE SEEN /HPF (ref 0–10)
RBC MICROSCOPIC, UA: NONE SEEN /HPF (ref 0–2)

## 2017-11-29 LAB — BLADDER SCAN AMB NON-IMAGING: Scan Result: 0

## 2017-11-29 MED ORDER — ESTRADIOL 0.1 MG/GM VA CREA: TOPICAL_CREAM | VAGINAL | 12 refills | Status: DC

## 2017-11-29 MED ORDER — ESTROGENS, CONJUGATED 0.625 MG/GM VA CREA: TOPICAL_CREAM | VAGINAL | 12 refills | Status: DC

## 2017-11-29 MED ORDER — MIRABEGRON ER 25 MG PO TB24: 25.0000 mg | ORAL_TABLET | Freq: Every day | ORAL | 0 refills | Status: DC

## 2017-11-29 NOTE — Progress Notes (Signed)
11/29/2017 12:00 PM   Danielle Vang 05-01-1940 [id number]  Referring provider: Marcial Pacas, [place] [address], [place] [address]  Chief Complaint  Patient presents with  . Recurrent UTI    HPI: Patient is a 77 -year-old Caucasian female who is referred to [place] by [facility] for UTI with sepsis.    She was admitted for UTI with sepsis on 11/09/2017 after presenting to the ED with chills.  Her UA was nitrate positive with 6-10 RBCs and greater than 50 WBCs.  Her urine culture was positive for E. coli that was resistant to Septra.  Her blood cultures were also positive.  Her lactic acid was 3.2, serum creatinine was 0.77, WBC count was 9.0 and her hemoglobin A1c was 8.4.  Was discharged on November 13, 2017.    Patient states that she has had 3 different antibiotics a few months ago for an UTI.    Her symptoms with a urinary tract infection consist of frequency, cloudy and dysuria.  Her CATH UA today is positive for nitrites and many bacteria.  Her PVR is 100 mL.    She has been having urgency and urge incontinence for over one year.  She is managing with pads at this time.  Patient denies any gross hematuria, dysuria or suprapubic/flank pain.  Patient denies any fevers, chills, nausea or vomiting.   She does not have a history of nephrolithiasis, GU surgery or GU trauma.   She is not sexually active.  She is postmenopausal.   She admits to an occasional diarrhea.   She does engage in good perineal hygiene. She does not take tub baths.   She is drinking one large glass of water daily.   She drinks one diet Pepsi daily.  She has one cup of coffee daily.  She drinks an occasional tea.  She drinks V8 and orange juice.  She does not drink alcohol.   Her Hgb A1c 7.8% in 10/2017.    PMH: Past Medical History:  Diagnosis Date  . Anxiety   . Cancer ([place])    skin ca  . Coronary artery disease   . Diabetes mellitus without complication ([place])    . Hypertension     Surgical History: Past Surgical History:  Procedure Laterality Date  . ABDOMINAL HYSTERECTOMY    . BREAST BIOPSY Left    neg  . CHOLECYSTECTOMY    . CORONARY STENT PLACEMENT      Home Medications:  Allergies as of 11/29/2017   No Known Allergies     Medication List        Accurate as of 11/29/17 12:00 PM. Always use your most recent med list.          ALPRAZolam 0.5 MG tablet Commonly known as:  XANAX Take 0.5 mg by mouth at bedtime as needed for anxiety.   aspirin EC 325 MG tablet Take 325 mg by mouth daily.   atenolol 50 MG tablet Commonly known as:  TENORMIN Take 50 mg by mouth daily.   cholecalciferol 1000 units tablet Commonly known as:  VITAMIN D Take 4,000 Units by mouth daily.   glipiZIDE 10 MG tablet Commonly known as:  GLUCOTROL Take 10 mg by mouth daily before breakfast.   hydrochlorothiazide 12.5 MG capsule Commonly known as:  MICROZIDE Take 1 capsule by mouth daily.   ibuprofen 400 MG tablet Commonly known as:  ADVIL,MOTRIN Take 1 tablet (400 mg total) by mouth every 6 (six) hours as  needed.   lisinopril 20 MG tablet Commonly known as:  PRINIVIL,ZESTRIL Take 20 mg by mouth daily.   meloxicam 7.5 MG tablet Commonly known as:  MOBIC Take 1 tablet by mouth daily.   metFORMIN 1000 MG tablet Commonly known as:  GLUCOPHAGE Take 1,000 mg by mouth 2 (two) times daily with a meal.   methocarbamol 750 MG tablet Commonly known as:  ROBAXIN Take 1 tablet (750 mg total) by mouth every 4 (four) hours.   mirabegron ER 25 MG Tb24 tablet Commonly known as:  MYRBETRIQ Take 1 tablet (25 mg total) by mouth daily.   NOVOLIN 70/30 (70-30) 100 UNIT/ML injection Generic drug:  insulin NPH-regular Human Inject 14-20 Units into the skin 2 (two) times daily. 14UNITS-AM,  20UNITS-PM   sertraline 50 MG tablet Commonly known as:  ZOLOFT Take 1 tablet by mouth daily.   simvastatin 20 MG tablet Commonly known as:  ZOCOR Take 20 mg  by mouth daily.       Allergies: No Known Allergies  Family History: Family History  Problem Relation Age of Onset  . Alzheimer's disease Mother   . Migraines Father   . Alcohol abuse Father   . Breast cancer Neg Hx     Social History:  reports that she quit smoking about 10 years ago. She has never used smokeless tobacco. She reports that she does not drink alcohol or use drugs.  ROS: UROLOGY Frequent Urination?: Yes Hard to postpone urination?: Yes Burning/pain with urination?: No Get up at night to urinate?: Yes Leakage of urine?: Yes Urine stream starts and stops?: No Trouble starting stream?: No Do you have to strain to urinate?: No Blood in urine?: No Urinary tract infection?: No Sexually transmitted disease?: No Injury to kidneys or bladder?: No Painful intercourse?: No Weak stream?: No Currently pregnant?: No Vaginal bleeding?: No Last menstrual period?: n  Gastrointestinal Nausea?: No Vomiting?: No Indigestion/heartburn?: No Diarrhea?: No Constipation?: No  Constitutional Fever: No Night sweats?: No Weight loss?: No Fatigue?: No  Skin Skin rash/lesions?: No Itching?: No  Eyes Blurred vision?: No Double vision?: No  Ears/Nose/Throat Sore throat?: No Sinus problems?: No  Hematologic/Lymphatic Swollen glands?: No Easy bruising?: No  Cardiovascular Leg swelling?: No  Respiratory Cough?: No Shortness of breath?: No  Endocrine Excessive thirst?: No  Musculoskeletal Back pain?: No Joint pain?: No  Neurological Headaches?: No Dizziness?: No  Psychologic Depression?: No Anxiety?: No  Physical Exam: BP 108/62 (BP Location: Left Arm, Patient Position: Sitting, Cuff Size: Normal)   Pulse 66   Ht 5\' 2"  (1.575 m)   Wt 154 lb 11.2 oz (70.2 kg)   BMI 28.30 kg/m   Constitutional:  Well nourished. Alert and oriented, No acute distress. HEENT: [place] AT, moist mucus membranes.  Trachea midline, no masses. Cardiovascular: No clubbing,  cyanosis, or edema. Respiratory: Normal respiratory effort, no increased work of breathing. GI: Abdomen is soft, non tender, non distended, no abdominal masses. Liver and spleen not palpable.  No hernias appreciated.  Stool sample for occult testing is not indicated.   GU: No CVA tenderness.  No bladder fullness or masses.   Skin: No rashes, bruises or suspicious lesions. Lymph: No cervical or inguinal adenopathy. Neurologic: Grossly intact, no focal deficits, moving all 4 extremities. Psychiatric: Normal mood and affect.  Laboratory Data: Lab Results  Component Value Date   WBC 7.1 11/10/2017   HGB 11.5 (L) 11/10/2017   HCT 34.2 (L) 11/10/2017   MCV 93.9 11/10/2017   PLT 124 (L) 11/10/2017  Lab Results  Component Value Date   CREATININE 0.75 11/10/2017    No results found for: PSA  No results found for: TESTOSTERONE  Lab Results  Component Value Date   HGBA1C 8.4 (H) 11/09/2017    No results found for: TSH  No results found for: CHOL, HDL, CHOLHDL, VLDL, LDLCALC  Lab Results  Component Value Date   AST 26 11/09/2017   Lab Results  Component Value Date   ALT 16 11/09/2017   No components found for: ALKALINEPHOPHATASE No components found for: BILIRUBINTOTAL  No results found for: ESTRADIOL  Urinalysis CATH UA nitrite positive and many bacteria.  See Epic.  I have reviewed the labs.  Assessment & Plan:    1. UTI with sepsis Discharged on 11/13/2017 Patient is instructed to increase their water intake until the urine is pale yellow or clear (10 to 12 cups daily)  Patient is instructed to take probiotics (yogurt, oral pills or vaginal suppositories), take cranberry pills or drink the juice to acidify the urine  Avoid soaking in tubs and wipe front to back after urinating   2. Vaginal atrophy I explained to the patient that when women go through menopause and her estrogen levels are severely diminished, the normal vaginal flora will change.  This is due to  an increase of the vaginal canal's pH. Because of this, the vaginal canal may be colonized by bacteria from the rectum instead of the protective lactobacillus.  This, accompanied by the loss of the mucus barrier with vaginal atrophy, is a cause of recurrent urinary tract infections. In some studies, the use of vaginal estrogen cream has been demonstrated to reduce  recurrent urinary tract infections to one a year.  Patient was given a sample of vaginal estrogen cream (Premarin vaginal cream) and instructed to apply 0.5mg  (pea-sized amount)  just inside the vaginal introitus with a finger-tip on Monday, Wednesday and Friday nights.  I explained to the patient that vaginally administered estrogen, which causes only a slight increase in the blood estrogen levels, have fewer contraindications and adverse systemic effects that oral HT. I have also given prescriptions for the Estrace cream and Premarin cream, so that the patient may carry them to the pharmacy to see which one of the branded creams would be most economical for her.  If she finds both medications cost prohibitive, she is instructed to call the office.  We can then call in a compounded vaginal estrogen cream for the patient that may be more affordable.  She will follow up in three weeks for an exam.    3. Urge incontinence Discussed behavioral therapies, bladder training and bladder control strategies Fluid management  Offered medical therapy with beta-3 adrenergic receptor agonist - would like to try the beta-3 adrenergic receptor agonist (Myrbetriq).  Given Myrbetriq 25 mg samples, #28.  I have reviewed with the patient of the side effects of Myrbetriq, such as: elevation in BP, urinary retention and/or HA.   RTC in 3 weeks for PVR and symptom recheck                                       Return in about 3 weeks (around 12/20/2017) for OAB questionnaire, PVR and exam.  These notes generated with voice recognition software. I apologize for  typographical errors.  Zara Council, PA-C  [facility] [address]  [place], [place] [address] [phone]  

## 2017-11-29 NOTE — Progress Notes (Signed)
In and Out Catheterization  Patient is present today for a I & O catheterization due to urinary frequency. Patient was cleaned and prepped in a sterile fashion with betadine and Lidocaine 2% jelly was instilled into the urethra.  A 14FR cath was inserted no complications were noted , 159ml of urine return was noted, urine was yellow in color. A clean urine sample was collected for UA. Bladder was drained  And catheter was removed with out difficulty.    Preformed by: KDM cma

## 2017-11-29 NOTE — Patient Instructions (Signed)
I have given you two prescriptions for a vaginal estrogen cream.  Estrace and Premarin.  Please take these to your pharmacy and see which one your insurance covers.  If both are too expensive, please call the office at [phone] for an alternative.  You are given a sample of vaginal estrogen cream Premarin and instructed to apply 0.5mg (pea-sized amount)  just inside the vaginal introitus with a finger-tip on Monday, Wednesday and Friday nights,     

## 2017-12-02 LAB — CULTURE, URINE COMPREHENSIVE

## 2017-12-04 ENCOUNTER — Telehealth: Payer: Self-pay | Admitting: Family Medicine

## 2017-12-04 MED ORDER — AMOXICILLIN-POT CLAVULANATE 875-125 MG PO TABS: 1.0000 | ORAL_TABLET | Freq: Two times a day (BID) | ORAL | 0 refills | Status: DC

## 2017-12-04 NOTE — Telephone Encounter (Signed)
Patient notified and RX sent to pharmacy.  

## 2017-12-04 NOTE — Telephone Encounter (Signed)
-----   Message from Nori Riis, PA-C sent at 12/04/2017  7:49 AM EDT ----- Please let Mrs. Danielle Vang know that her urine culture was positive for infection.  She needs to start Augmentin 875/125, twice daily for 7 days.

## 2017-12-29 ENCOUNTER — Other Ambulatory Visit: Payer: Self-pay | Admitting: Urology

## 2017-12-29 ENCOUNTER — Encounter: Payer: Self-pay | Admitting: Urology

## 2017-12-29 ENCOUNTER — Ambulatory Visit (INDEPENDENT_AMBULATORY_CARE_PROVIDER_SITE_OTHER): Payer: Medicare HMO | Admitting: Urology

## 2017-12-29 ENCOUNTER — Other Ambulatory Visit: Payer: Self-pay

## 2017-12-29 VITALS — BP 148/73 | HR 67 | Wt 158.0 lb

## 2017-12-29 DIAGNOSIS — N952 Postmenopausal atrophic vaginitis: Secondary | ICD-10-CM

## 2017-12-29 DIAGNOSIS — Z87440 Personal history of urinary (tract) infections: Secondary | ICD-10-CM

## 2017-12-29 DIAGNOSIS — R3915 Urgency of urination: Secondary | ICD-10-CM

## 2017-12-29 MED ORDER — MIRABEGRON ER 50 MG PO TB24: 50.0000 mg | ORAL_TABLET | Freq: Every day | ORAL | 0 refills | Status: DC

## 2017-12-29 NOTE — Progress Notes (Signed)
12/29/2017 12:22 PM   Danielle Vang 1940/07/22 [id number]  Referring provider: Marcial Pacas, [place] [address], [place] [address]  Chief Complaint  Patient presents with  . Follow-up    HPI: Patient is a 77 -year-old Caucasian female with a history of UTI with sepsis, vaginal atrophy and urge incontinence who presents today for a three week follow up after a trial of Myrbetriq 25 mg daily.   Background history Referred to [place] by [facility] for UTI with sepsis.  She was admitted for UTI with sepsis on 11/09/2017 after presenting to the ED with chills.  Her UA was nitrate positive with 6-10 RBCs and greater than 50 WBCs.  Her urine culture was positive for E. coli that was resistant to Septra.  Her blood cultures were also positive.  Her lactic acid was 3.2, serum creatinine was 0.77, WBC count was 9.0 and her hemoglobin A1c was 8.4.  Was discharged on November 13, 2017.  Patient states that she has had 3 different antibiotics a few months ago for an UTI.  Her symptoms with a urinary tract infection consist of frequency, cloudy and dysuria.  Her CATH UA today is positive for nitrites and many bacteria.  Her PVR is 100 mL.  She has been having urgency and urge incontinence for over one year.  She is managing with pads at this time.  Patient denies any gross hematuria, dysuria or suprapubic/flank pain.  Patient denies any fevers, chills, nausea or vomiting.  She does not have a history of nephrolithiasis, GU surgery or GU trauma.  She is not sexually active.  She is postmenopausal.  She admits to an occasional diarrhea.  She does engage in good perineal hygiene. She does not take tub baths.   She is drinking one large glass of water daily.   She drinks one diet Pepsi daily.  She has one cup of coffee daily.  She drinks an occasional tea.  She drinks V8 and orange juice.  She does not drink alcohol.   Her Hgb A1c 7.8% in 10/2017.    At her visit on  11/29/2017, her urine was sent for culture, she was started on Myrbetriq and vaginal estrogen cream.  Urine culture was positive for E.coli  Resistant to tetracycline and trimethoprim/sulfa.    Today, The patient is  experiencing urgency x 0-3, frequency x 0-3, not restricting fluids to avoid visits to the restroom, is engaging in toilet mapping, incontinence x 0-3 and nocturia x 0-3.   Her BP is 148/73.   Her PVR is 19 mL.  Patient denies any gross hematuria, dysuria or suprapubic/flank pain.  Patient denies any fevers, chills, nausea or vomiting.   She states the Myrbetriq 25 mg daily was helpful, but she is not quite at goal.  She is having frequency, urgency and nocturia.    She is using the vaginal estrogen cream three nights weekly.     PMH: Past Medical History:  Diagnosis Date  . Anxiety   . Cancer ([place])    skin ca  . Coronary artery disease   . Diabetes mellitus without complication ([place])   . Hypertension     Surgical History: Past Surgical History:  Procedure Laterality Date  . ABDOMINAL HYSTERECTOMY    . BREAST BIOPSY Left    neg  . CHOLECYSTECTOMY    . CORONARY STENT PLACEMENT      Home Medications:  Allergies as of 12/29/2017   No Known Allergies  Medication List        Accurate as of 12/29/17 12:22 PM. Always use your most recent med list.          ALPRAZolam 0.5 MG tablet Commonly known as:  XANAX Take 0.5 mg by mouth at bedtime as needed for anxiety.   amoxicillin-clavulanate 875-125 MG tablet Commonly known as:  AUGMENTIN Take 1 tablet by mouth every 12 (twelve) hours.   aspirin EC 325 MG tablet Take 325 mg by mouth daily.   atenolol 50 MG tablet Commonly known as:  TENORMIN Take 50 mg by mouth daily.   cholecalciferol 1000 units tablet Commonly known as:  VITAMIN D Take 4,000 Units by mouth daily.   conjugated estrogens vaginal cream Commonly known as:  PREMARIN Apply 0.5mg  (pea-sized amount)  just inside the vaginal introitus with  a finger-tip on  Monday, Wednesday and Friday nights.   estradiol 0.1 MG/GM vaginal cream Commonly known as:  ESTRACE Apply 0.5mg  (pea-sized amount)  just inside the vaginal introitus with a finger-tip on Monday, Wednesday and Friday nights.   glipiZIDE 10 MG tablet Commonly known as:  GLUCOTROL Take 10 mg by mouth daily before breakfast.   hydrochlorothiazide 12.5 MG capsule Commonly known as:  MICROZIDE Take 1 capsule by mouth daily.   ibuprofen 400 MG tablet Commonly known as:  ADVIL,MOTRIN Take 1 tablet (400 mg total) by mouth every 6 (six) hours as needed.   lisinopril 20 MG tablet Commonly known as:  PRINIVIL,ZESTRIL Take 20 mg by mouth daily.   meloxicam 7.5 MG tablet Commonly known as:  MOBIC Take 1 tablet by mouth daily.   metFORMIN 1000 MG tablet Commonly known as:  GLUCOPHAGE Take 1,000 mg by mouth 2 (two) times daily with a meal.   methocarbamol 750 MG tablet Commonly known as:  ROBAXIN Take 1 tablet (750 mg total) by mouth every 4 (four) hours.   mirabegron ER 25 MG Tb24 tablet Commonly known as:  MYRBETRIQ Take 1 tablet (25 mg total) by mouth daily.   NOVOLIN 70/30 (70-30) 100 UNIT/ML injection Generic drug:  insulin NPH-regular Human Inject 14-20 Units into the skin 2 (two) times daily. 14UNITS-AM,  20UNITS-PM   sertraline 50 MG tablet Commonly known as:  ZOLOFT Take 1 tablet by mouth daily.   simvastatin 20 MG tablet Commonly known as:  ZOCOR Take 20 mg by mouth daily.       Allergies: No Known Allergies  Family History: Family History  Problem Relation Age of Onset  . Alzheimer's disease Mother   . Migraines Father   . Alcohol abuse Father   . Breast cancer Neg Hx     Social History:  reports that she quit smoking about 10 years ago. She has never used smokeless tobacco. She reports that she does not drink alcohol or use drugs.  ROS: UROLOGY Frequent Urination?: Yes Hard to postpone urination?: Yes Burning/pain with urination?:  Yes Get up at night to urinate?: Yes Leakage of urine?: No Urine stream starts and stops?: No Trouble starting stream?: No Do you have to strain to urinate?: No Blood in urine?: No Urinary tract infection?: No Sexually transmitted disease?: No Injury to kidneys or bladder?: No Painful intercourse?: No Weak stream?: No Currently pregnant?: No Vaginal bleeding?: No Last menstrual period?: n  Gastrointestinal Nausea?: No Vomiting?: No Indigestion/heartburn?: No Diarrhea?: No Constipation?: No  Constitutional Fever: No Night sweats?: No Weight loss?: No Fatigue?: No  Skin Skin rash/lesions?: No Itching?: No  Eyes Blurred vision?: No Double vision?:  No  Ears/Nose/Throat Sore throat?: No Sinus problems?: No  Hematologic/Lymphatic Swollen glands?: No Easy bruising?: No  Cardiovascular Leg swelling?: No Chest pain?: No  Respiratory Cough?: No Shortness of breath?: No  Endocrine Excessive thirst?: No  Musculoskeletal Back pain?: Yes Joint pain?: No  Neurological Headaches?: No Dizziness?: No  Psychologic Depression?: No Anxiety?: No  Physical Exam: BP (!) 148/73   Pulse 67   Wt 158 lb (71.7 kg)   BMI 28.90 kg/m   Constitutional: Well nourished. Alert and oriented, No acute distress. HEENT: [place] AT, moist mucus membranes. Trachea midline, no masses. Cardiovascular: No clubbing, cyanosis, or edema. Respiratory: Normal respiratory effort, no increased work of breathing. Skin: No rashes, bruises or suspicious lesions. Neurologic: Grossly intact, no focal deficits, moving all 4 extremities. Psychiatric: Normal mood and affect.  Laboratory Data: Lab Results  Component Value Date   WBC 7.1 11/10/2017   HGB 11.5 (L) 11/10/2017   HCT 34.2 (L) 11/10/2017   MCV 93.9 11/10/2017   PLT 124 (L) 11/10/2017    Lab Results  Component Value Date   CREATININE 0.75 11/10/2017    No results found for: PSA  No results found for: TESTOSTERONE  Lab  Results  Component Value Date   HGBA1C 8.4 (H) 11/09/2017    No results found for: TSH  No results found for: CHOL, HDL, CHOLHDL, VLDL, LDLCALC  Lab Results  Component Value Date   AST 26 11/09/2017   Lab Results  Component Value Date   ALT 16 11/09/2017   No components found for: ALKALINEPHOPHATASE No components found for: BILIRUBINTOTAL  No results found for: ESTRADIOL  Urinalysis CATH UA nitrite positive and many bacteria.  See Epic.  I have reviewed the labs.  Assessment & Plan:    1. History of UTI with sepsis No UTI's since last visit  2. Vaginal atrophy Continue the vaginal estrogen cream three nights weekly She will follow up in three weeks for an exam.    3. Urge incontinence Increase the Myrbetriq to 50 mg daily as she is not at goal RTC in 1 month for PVR and OAB questionnaire                                     Return in about 1 month (around 01/28/2018) for PVR and OAB questionnaire.  These notes generated with voice recognition software. I apologize for typographical errors.  Zara Council, PA-C  [facility] [address]  [place], [place] [address] [phone]

## 2017-12-29 NOTE — Telephone Encounter (Signed)
Would you call in the compounded estrogen cream for this patient?  She would like the pharmacy to send it to her if possible.

## 2018-01-08 MED ORDER — AMBULATORY NON FORMULARY MEDICATION: 2 refills | Status: DC

## 2018-01-08 NOTE — Telephone Encounter (Signed)
Compound estrogen was called into [place].

## 2018-01-29 ENCOUNTER — Ambulatory Visit: Payer: Medicare HMO | Admitting: Urology

## 2018-01-30 ENCOUNTER — Other Ambulatory Visit: Payer: Self-pay | Admitting: Family Medicine

## 2018-01-30 ENCOUNTER — Telehealth: Payer: Self-pay | Admitting: Family Medicine

## 2018-01-30 NOTE — Telephone Encounter (Signed)
Called and spoke to Aguila at [facility]. Informed pharmacist pt did not have provider with this group. PCP name provided. Ruba stated she would notify current provider.

## 2018-01-30 NOTE — Telephone Encounter (Signed)
Copied from [place] [phone]. Topic: Quick Communication - Rx Refill/Question >> Jan 30, 2018  5:25 PM Jackey Loge, Lenna Sciara wrote: Medication: NOVOLIN 70/30 (70-30) 100 UNIT/ML injection [[id number]]   Has the patient contacted their pharmacy? Yes.   (Agent: If no, request that the patient contact the pharmacy for the refill.) (Agent: If yes, when and what did the pharmacy advise?) Rupa from [facility] stated they faxed medication refill request over 2 x   Preferred Pharmacy (with phone number or street name):   Agent: Please be advised that RX refills may take up to 3 business days. We ask that you follow-up with your pharmacy.

## 2018-02-14 ENCOUNTER — Encounter: Payer: Self-pay | Admitting: Emergency Medicine

## 2018-02-14 ENCOUNTER — Ambulatory Visit
Admission: EM | Admit: 2018-02-14 | Discharge: 2018-02-14 | Disposition: A | Discharge status: Home / Self Care | Payer: Medicare HMO | Attending: Physician Assistant | Admitting: Physician Assistant

## 2018-02-14 ENCOUNTER — Other Ambulatory Visit: Payer: Self-pay

## 2018-02-14 DIAGNOSIS — R05 Cough: Secondary | ICD-10-CM | POA: Insufficient documentation

## 2018-02-14 DIAGNOSIS — J209 Acute bronchitis, unspecified: Secondary | ICD-10-CM

## 2018-02-14 DIAGNOSIS — R059 Cough, unspecified: Secondary | ICD-10-CM

## 2018-02-14 MED ORDER — PROMETHAZINE-DM 6.25-15 MG/5ML PO SYRP: 5.0000 mL | ORAL_SOLUTION | Freq: Every evening | ORAL | 0 refills | Status: AC | PRN

## 2018-02-14 MED ORDER — AZITHROMYCIN 250 MG PO TABS: 250.0000 mg | ORAL_TABLET | Freq: Every day | ORAL | 0 refills | Status: DC

## 2018-02-14 MED ORDER — BENZONATATE 200 MG PO CAPS: 200.0000 mg | ORAL_CAPSULE | Freq: Three times a day (TID) | ORAL | 0 refills | Status: AC

## 2018-02-14 NOTE — Discharge Instructions (Signed)
BRONCHITIS: Take Mucinex throughout the day and drink plenty of fluids to break up the mucus . Take cough syrup at bedtime if needed, for cough and to assist with sleep. Take Ibuprofen or other NSAID for relief of any pleuritic pain. Increase rest and fluid intake. Return to the clinic, your PCP, or ER if you have any new/ worsening symptoms such as fever, chest pain, difficulty breathing, worsening cough, mental status changes, lethargy, etc

## 2018-02-14 NOTE — ED Provider Notes (Signed)
[facility]    CSN: [id number] Arrival date & time: 02/14/18  0804     History   Chief Complaint Chief Complaint  Patient presents with  . Cough    HPI Danielle Vang is a 78 y.o. female. Patient presents with approximately 10 day history of productive cough, headaches, nasal congestion, and mild sore throat. She denies fever, fatigue, body aches, chills/sweats, shortness of breath, chest pain, n/v. She says her symptoms have not worsened, but they have not improved. She has been taking OTC cough medications and Aspirin. She says the cough keeps her awake at night. She has no other concerns/complaints today.  HPI  Past Medical History:  Diagnosis Date  . Anxiety   . Cancer ([place])    skin ca  . Coronary artery disease   . Diabetes mellitus without complication ([place])   . Hypertension     Patient Active Problem List   Diagnosis Date Noted  . Coronary artery disease 11/29/2017  . DDD (degenerative disc disease), cervical 11/29/2017  . Hyperlipidemia 11/29/2017  . Hypertension 11/29/2017  . Neck pain 11/29/2017  . Osteoporosis 11/29/2017  . Pain in thoracic spine 11/29/2017  . Senile nuclear sclerosis 11/29/2017  . Vertigo, benign positional 11/29/2017  . UTI (urinary tract infection) 11/09/2017  . Spinal stenosis of lumbar region without neurogenic claudication 09/05/2017  . Uncontrolled type 2 diabetes mellitus with circulatory disorder, with long-term current use of insulin ([place]) 10/16/2013  . Ischial bursitis of right side 07/03/2013  . Diastasis recti 10/02/2012  . Depression 07/13/2011    Past Surgical History:  Procedure Laterality Date  . ABDOMINAL HYSTERECTOMY    . BREAST BIOPSY Left    neg  . CHOLECYSTECTOMY    . CORONARY STENT PLACEMENT      OB History   No obstetric history on file.      Home Medications    Prior to Admission medications   Medication Sig Start Date End Date Taking? Authorizing Provider  ALPRAZolam Duanne Moron)  0.5 MG tablet Take 0.5 mg by mouth at bedtime as needed for anxiety.   Yes [provider]  AMBULATORY NON FORMULARY MEDICATION Apply peasized amount nightly for two weeks, then Monday, Wednesday and Friday thereafter 01/08/18  Yes McGowan, Larene Beach A, PA-C  aspirin EC 325 MG tablet Take 325 mg by mouth daily.   Yes [provider]  atenolol (TENORMIN) 50 MG tablet Take 50 mg by mouth daily.    Yes [provider]  cholecalciferol (VITAMIN D) 1000 units tablet Take 4,000 Units by mouth daily.   Yes [provider]  conjugated estrogens (PREMARIN) vaginal cream Apply 0.5mg  (pea-sized amount)  just inside the vaginal introitus with a finger-tip on  Monday, Wednesday and Friday nights. 11/29/17  Yes McGowan, Larene Beach A, PA-C  glipiZIDE (GLUCOTROL) 10 MG tablet Take 10 mg by mouth daily before breakfast.    Yes [provider]  hydrochlorothiazide (MICROZIDE) 12.5 MG capsule Take 1 capsule by mouth daily. 08/30/17  Yes [provider]  ibuprofen (ADVIL,MOTRIN) 400 MG tablet Take 1 tablet (400 mg total) by mouth every 6 (six) hours as needed. 03/06/17  Yes Melynda Ripple, MD  lisinopril (PRINIVIL,ZESTRIL) 20 MG tablet Take 20 mg by mouth daily.    Yes [provider]  meloxicam (MOBIC) 7.5 MG tablet Take 1 tablet by mouth daily. 09/22/17  Yes [provider]  metFORMIN (GLUCOPHAGE) 1000 MG tablet Take 1,000 mg by mouth 2 (two) times daily with a meal.  Yes [provider]  mirabegron ER (MYRBETRIQ) 50 MG TB24 tablet Take 1 tablet (50 mg total) by mouth daily. 12/29/17  Yes McGowan, Shannon A, PA-C  NOVOLIN 70/30 (70-30) 100 UNIT/ML injection Inject 14-20 Units into the skin 2 (two) times daily. 14UNITS-AM,  20UNITS-PM 10/31/17  Yes [provider]  sertraline (ZOLOFT) 50 MG tablet Take 1 tablet by mouth daily. 08/30/17  Yes [provider]  simvastatin (ZOCOR) 20 MG tablet Take 20 mg by mouth daily.   Yes  [provider]  amoxicillin-clavulanate (AUGMENTIN) 875-125 MG tablet Take 1 tablet by mouth every 12 (twelve) hours. 12/04/17   Zara Council A, PA-C  azithromycin (ZITHROMAX) 250 MG tablet Take 1 tablet (250 mg total) by mouth daily. Take first 2 tablets together, then 1 every day until finished. 02/14/18   Danton Clap, PA-C  benzonatate (TESSALON) 200 MG capsule Take 1 capsule (200 mg total) by mouth every 8 (eight) hours for 10 days. 02/14/18 02/24/18  Danton Clap, PA-C  estradiol (ESTRACE VAGINAL) 0.1 MG/GM vaginal cream Apply 0.5mg  (pea-sized amount)  just inside the vaginal introitus with a finger-tip on Monday, Wednesday and Friday nights. 11/29/17   McGowan, Hunt Oris, PA-C  methocarbamol (ROBAXIN) 750 MG tablet Take 1 tablet (750 mg total) by mouth every 4 (four) hours. 03/06/17   Melynda Ripple, MD  promethazine-dextromethorphan (PROMETHAZINE-DM) 6.25-15 MG/5ML syrup Take 5 mLs by mouth at bedtime as needed for up to 10 days for cough. 02/14/18 02/24/18  Danton Clap, PA-C    Family History Family History  Problem Relation Age of Onset  . Alzheimer's disease Mother   . Migraines Father   . Alcohol abuse Father   . Breast cancer Neg Hx     Social History Social History   Tobacco Use  . Smoking status: Former Smoker    Last attempt to quit: 2005    Years since quitting: 15.0  . Smokeless tobacco: Never Used  Substance Use Topics  . Alcohol use: No  . Drug use: No     Allergies   Patient has no known allergies.   Review of Systems Review of Systems  Constitutional: Negative for chills, fatigue and fever.  HENT: Positive for congestion, postnasal drip, rhinorrhea and sore throat. Negative for ear pain, sinus pressure and sinus pain.   Eyes: Negative for discharge and redness.  Respiratory: Positive for cough. Negative for chest tightness, shortness of breath and wheezing.   Cardiovascular: Negative for chest pain.  Gastrointestinal: Negative for  abdominal pain, diarrhea, nausea and vomiting.  Musculoskeletal: Negative for arthralgias and myalgias.  Skin: Negative for rash.  Allergic/Immunologic: Negative for environmental allergies.  Neurological: Positive for headaches. Negative for dizziness, weakness, light-headedness and numbness.  Hematological: Negative for adenopathy.     Physical Exam Triage Vital Signs ED Triage Vitals [02/14/18 0820]  Enc Vitals Group     BP (!) 115/56     Pulse Rate (!) 54     Resp 16     Temp 97.7 F (36.5 C)     Temp Source Oral     SpO2 96 %     Weight 155 lb (70.3 kg)     Height 5\' 2"  (1.575 m)     Head Circumference      Peak Flow      Pain Score 0     Pain Loc      Pain Edu?      Excl. in [place]?    No data  found.  Updated Vital Signs BP (!) 115/56 (BP Location: Left Arm)   Pulse (!) 54   Temp 97.7 F (36.5 C) (Oral)   Resp 16   Ht 5\' 2"  (1.575 m)   Wt 155 lb (70.3 kg)   SpO2 96%   BMI 28.35 kg/m   Visual Acuity Right Eye Distance:   Left Eye Distance:   Bilateral Distance:    Right Eye Near:   Left Eye Near:    Bilateral Near:     Physical Exam Vitals signs and nursing note reviewed.  Constitutional:      General: She is not in acute distress.    Appearance: Normal appearance. She is normal weight. She is not ill-appearing, toxic-appearing or diaphoretic.  HENT:     Head: Normocephalic and atraumatic.     Right Ear: Tympanic membrane, ear canal and external ear normal.     Left Ear: Tympanic membrane, ear canal and external ear normal.     Nose: Congestion and rhinorrhea (trace clear discharge) present. No mucosal edema.     Right Sinus: No maxillary sinus tenderness or frontal sinus tenderness.     Left Sinus: No maxillary sinus tenderness or frontal sinus tenderness.     Mouth/Throat:     Pharynx: Oropharynx is clear. No oropharyngeal exudate or posterior oropharyngeal erythema.     Comments: + clear PND Eyes:     General: No scleral icterus.       Right  eye: No discharge.        Left eye: No discharge.     Conjunctiva/sclera: Conjunctivae normal.  Neck:     Musculoskeletal: Neck supple.  Cardiovascular:     Rate and Rhythm: Regular rhythm. Bradycardia present.     Heart sounds: Normal heart sounds. No murmur.  Pulmonary:     Effort: Pulmonary effort is normal. No respiratory distress.     Breath sounds: Rhonchi (mild diffuse rhonchi throughout both lungs <L>R)) present. No wheezing or rales.  Lymphadenopathy:     Cervical: No cervical adenopathy.  Skin:    General: Skin is warm and dry.     Findings: No erythema or rash.  Neurological:     Mental Status: She is alert and oriented to person, place, and time. Mental status is at baseline.     Motor: No weakness.     Gait: Gait normal.  Psychiatric:        Mood and Affect: Mood normal.        Behavior: Behavior normal.        Thought Content: Thought content normal.      UC Treatments / Results  Labs (all labs ordered are listed, but only abnormal results are displayed) Labs Reviewed - No data to display  EKG None  Radiology No results found.  Procedures Procedures (including critical care time)  Medications Ordered in UC Medications - No data to display  Initial Impression / Assessment and Plan / UC Course  I have reviewed the triage vital signs and the nursing notes.  Pertinent labs & imaging results that were available during my care of the patient were reviewed by me and considered in my medical decision making (see chart for details).   Patient presenting w/ chest congestion x 10 days, rhonchi on exam. VSS. Advised patient this is likely viral, but due to her risk factors of DM and CAD, will cover patient with azithromycin. Advised to take Mucinex during the day and drink fluids. She may take benzonate to  help control cough if needed and night time cough medication. F/u if symptoms worsen. Patient understanding and agreeable.    Final Clinical Impressions(s) /  UC Diagnoses   Final diagnoses:  Acute bronchitis, unspecified organism  Cough     Discharge Instructions     BRONCHITIS: Take Mucinex throughout the day and drink plenty of fluids to break up the mucus . Take cough syrup at bedtime if needed, for cough and to assist with sleep. Take Ibuprofen or other NSAID for relief of any pleuritic pain. Increase rest and fluid intake. Return to the clinic, your PCP, or ER if you have any new/ worsening symptoms such as fever, chest pain, difficulty breathing, worsening cough, mental status changes, lethargy, etc    ED Prescriptions    Medication Sig Dispense Auth. Provider   azithromycin (ZITHROMAX) 250 MG tablet Take 1 tablet (250 mg total) by mouth daily. Take first 2 tablets together, then 1 every day until finished. 6 tablet Laurene Footman B, PA-C   benzonatate (TESSALON) 200 MG capsule Take 1 capsule (200 mg total) by mouth every 8 (eight) hours for 10 days. 30 capsule Danton Clap, PA-C   promethazine-dextromethorphan (PROMETHAZINE-DM) 6.25-15 MG/5ML syrup Take 5 mLs by mouth at bedtime as needed for up to 10 days for cough. 50 mL Laurene Footman B, PA-C     Controlled Substance Prescriptions [place] Controlled Substance Registry consulted? Not Applicable   Gretta Cool 02/15/18 2021

## 2018-02-14 NOTE — ED Triage Notes (Signed)
Patient in today c/o cough and cold x 1 week. Patient denies fever. Patient has tried OTC cough medicine, aspirin without relief.

## 2018-07-18 ENCOUNTER — Other Ambulatory Visit: Payer: Self-pay | Admitting: Family Medicine

## 2018-07-18 ENCOUNTER — Other Ambulatory Visit: Payer: Self-pay | Admitting: Internal Medicine

## 2018-07-18 DIAGNOSIS — Z1231 Encounter for screening mammogram for malignant neoplasm of breast: Secondary | ICD-10-CM

## 2018-08-30 ENCOUNTER — Other Ambulatory Visit: Payer: Self-pay

## 2018-08-30 ENCOUNTER — Ambulatory Visit
Admission: EM | Admit: 2018-08-30 | Discharge: 2018-08-30 | Discharge status: Against Medical Advice | Payer: Medicare HMO

## 2018-10-01 ENCOUNTER — Ambulatory Visit
Admission: RE | Admit: 2018-10-01 | Discharge: 2018-10-01 | Disposition: A | Discharge status: Home / Self Care | Payer: Medicare HMO | Source: Ambulatory Visit | Attending: Family Medicine | Admitting: Family Medicine

## 2018-10-01 ENCOUNTER — Other Ambulatory Visit: Payer: Self-pay

## 2018-10-01 DIAGNOSIS — Z1231 Encounter for screening mammogram for malignant neoplasm of breast: Secondary | ICD-10-CM

## 2019-01-24 IMAGING — CR DG RIBS W/ CHEST 3+V*L*
5 series · 5 of 5 positions shown · non-contrast
Comparison: None.

CLINICAL DATA: 76 y/o F; fall 1 month ago with left-sided lower
back pain.

EXAM:
LEFT RIBS AND CHEST - 3+ VIEW

[chest pa]
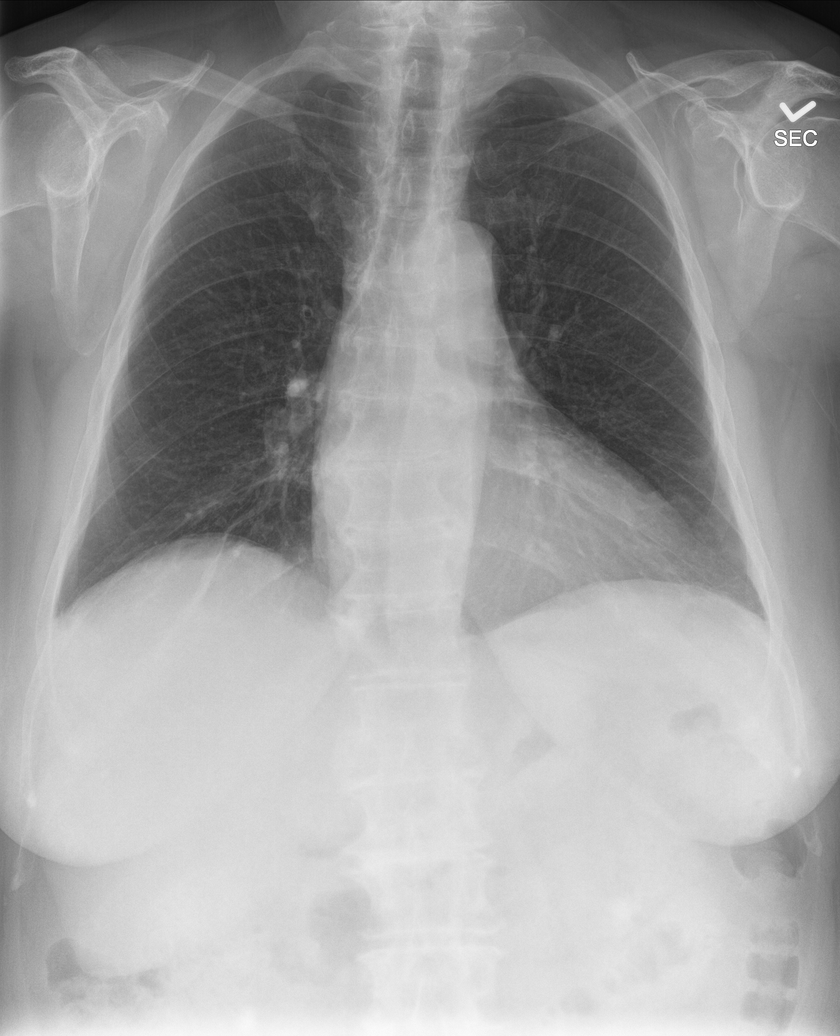

[rib obl (1 of 2)]
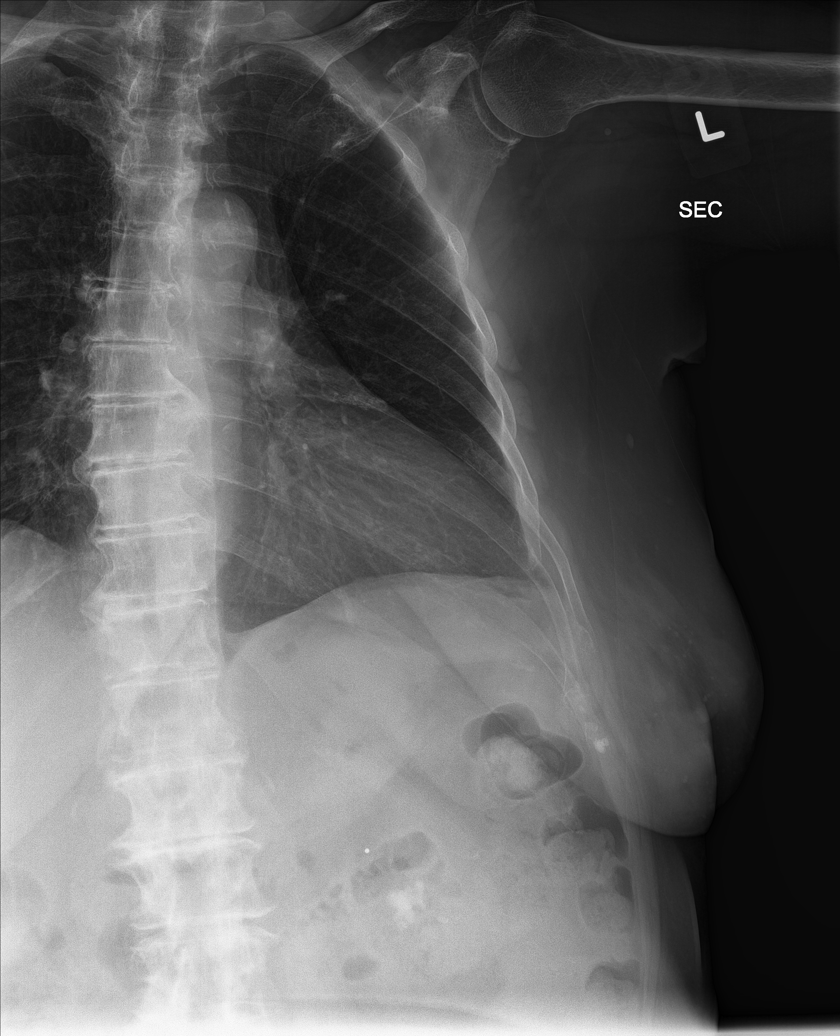

[rib obl (2 of 2)]
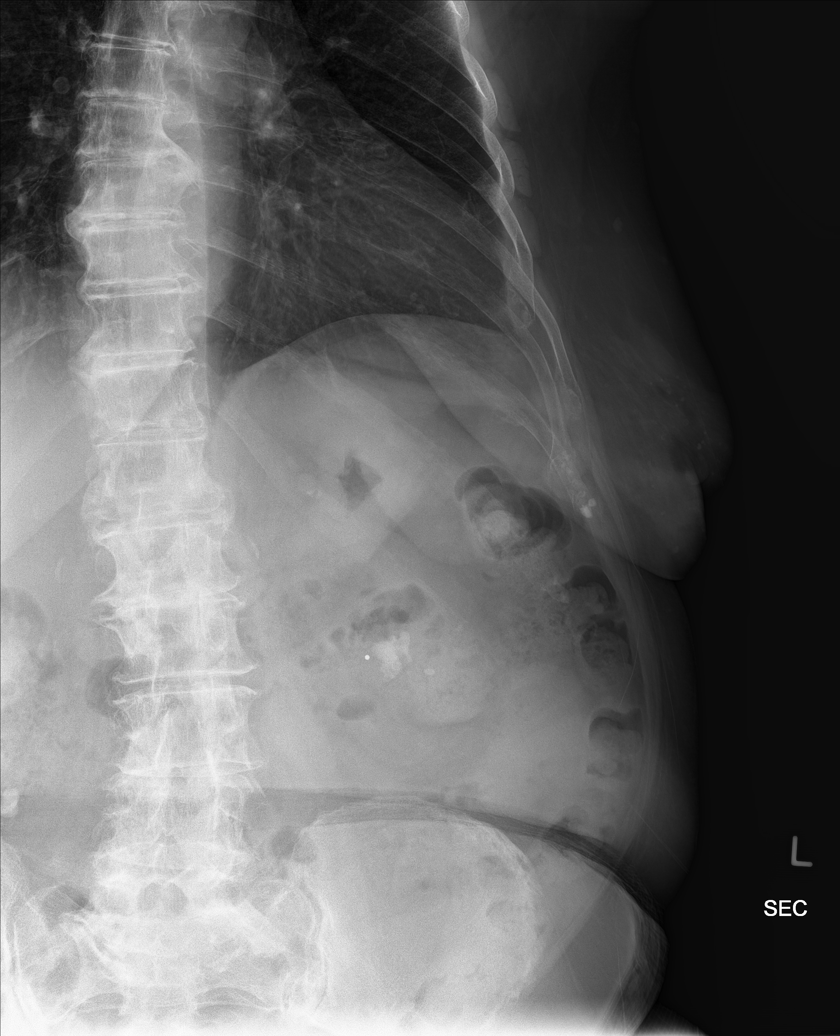

[rib pa (1 of 2)]
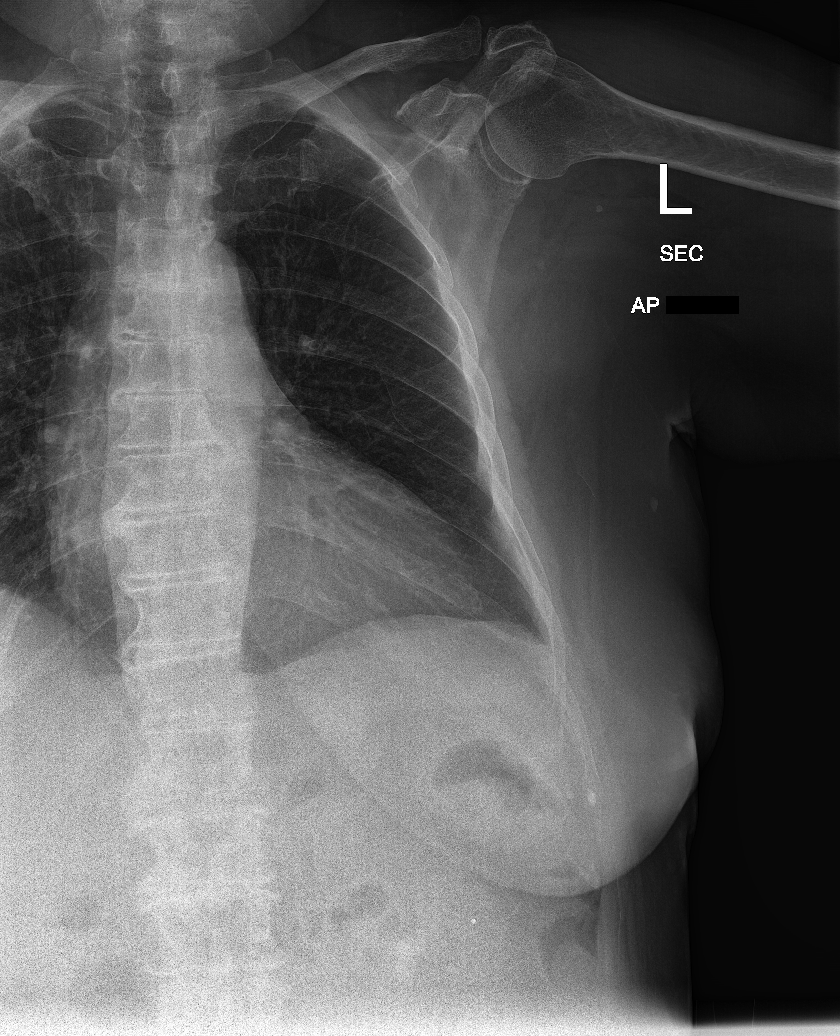

[rib pa (2 of 2)]
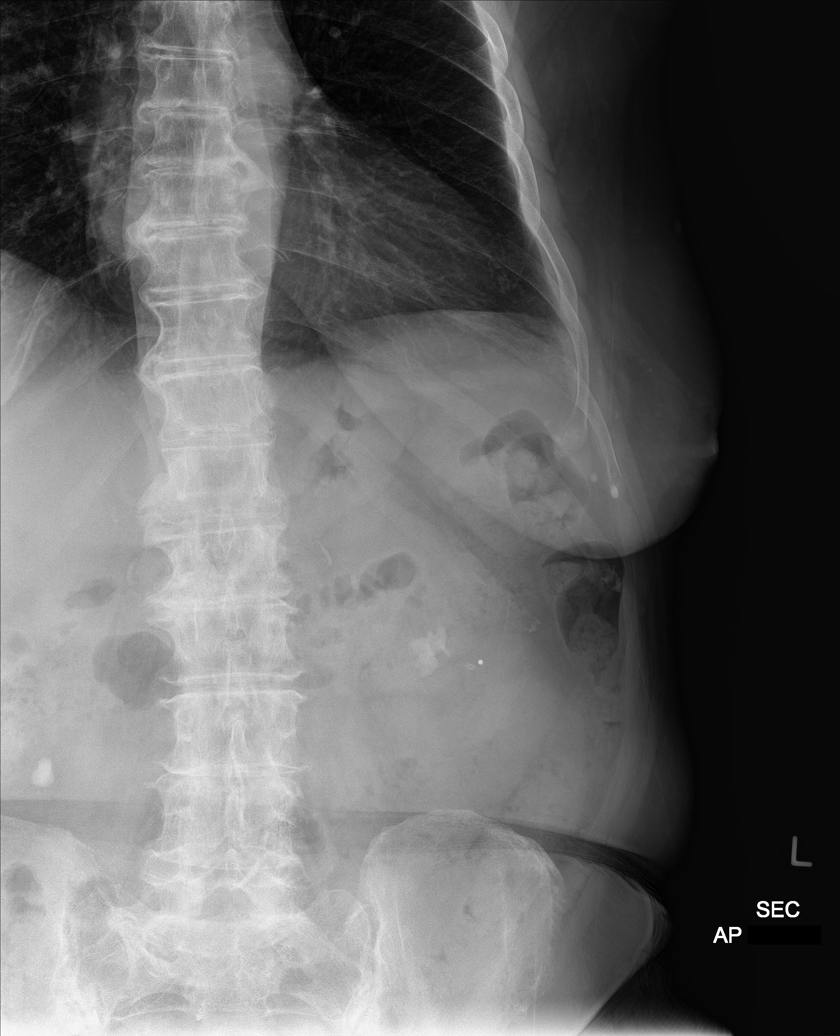

[5 of 5 positions shown; findings below may reference images not displayed]

FINDINGS: No fracture or other bone lesions are seen involving the ribs. There
is no evidence of pneumothorax or pleural effusion. Both lungs are
clear. Heart size and mediastinal contours are within normal limits.
Calcifications project over the kidneys bilaterally the largest
spanning 24 mm on the left, possible nephrolithiasis. Mild lumbar
spine levocurvature and moderate multilevel degenerative changes.
IMPRESSION: 1. No acute fracture identified.
2. Calcifications project over kidneys, possible nephrolithiasis.
3. Lumbar spine mild levocurvature and degenerative changes.

By: Mricardo Beitar M.D.

## 2019-02-22 ENCOUNTER — Other Ambulatory Visit: Payer: Self-pay

## 2019-02-22 ENCOUNTER — Ambulatory Visit (INDEPENDENT_AMBULATORY_CARE_PROVIDER_SITE_OTHER): Payer: Medicare HMO

## 2019-02-22 ENCOUNTER — Ambulatory Visit
Admission: EM | Admit: 2019-02-22 | Discharge: 2019-02-22 | Disposition: A | Discharge status: Home / Self Care | Payer: Medicare HMO | Attending: Family Medicine | Admitting: Family Medicine

## 2019-02-22 ENCOUNTER — Encounter: Payer: Self-pay | Admitting: Emergency Medicine

## 2019-02-22 DIAGNOSIS — M791 Myalgia, unspecified site: Secondary | ICD-10-CM

## 2019-02-22 DIAGNOSIS — M25571 Pain in right ankle and joints of right foot: Secondary | ICD-10-CM | POA: Diagnosis not present

## 2019-02-22 DIAGNOSIS — W1840XA Slipping, tripping and stumbling without falling, unspecified, initial encounter: Secondary | ICD-10-CM | POA: Diagnosis not present

## 2019-02-22 DIAGNOSIS — S93401A Sprain of unspecified ligament of right ankle, initial encounter: Secondary | ICD-10-CM

## 2019-02-22 DIAGNOSIS — U071 COVID-19: Secondary | ICD-10-CM | POA: Diagnosis not present

## 2019-02-22 DIAGNOSIS — R05 Cough: Secondary | ICD-10-CM

## 2019-02-22 LAB — SARS CORONAVIRUS 2 AG (30 MIN TAT): SARS Coronavirus 2 Ag: POSITIVE — AB

## 2019-02-22 NOTE — ED Provider Notes (Signed)
[facility]    CSN: [id number] Arrival date & time: 02/22/19  1234      History   Chief Complaint Chief Complaint  Patient presents with  . Ankle Pain  . Cough    HPI Danielle Vang is a 79 y.o. female.   79 yo female with a c/o right ankle pain since injuring it 5 days ago when she twisted it. States it's been painful and swollen, however has been able to bear weight on it. Patient denies falling.   Also states she's had a mild cough, runny nose, body aches, felt feverish and loss of appetite for the past 5 days also. States she found out that people she's been around with recently, have tested positive for covid. Denies any chest pains or shortness of breath.    Ankle Pain Cough   Past Medical History:  Diagnosis Date  . Anxiety   . Cancer ([place])    skin ca  . Coronary artery disease   . Diabetes mellitus without complication ([place])   . Hypertension     Patient Active Problem List   Diagnosis Date Noted  . Coronary artery disease 11/29/2017  . DDD (degenerative disc disease), cervical 11/29/2017  . Hyperlipidemia 11/29/2017  . Hypertension 11/29/2017  . Neck pain 11/29/2017  . Osteoporosis 11/29/2017  . Pain in thoracic spine 11/29/2017  . Senile nuclear sclerosis 11/29/2017  . Vertigo, benign positional 11/29/2017  . UTI (urinary tract infection) 11/09/2017  . Spinal stenosis of lumbar region without neurogenic claudication 09/05/2017  . Uncontrolled type 2 diabetes mellitus with circulatory disorder, with long-term current use of insulin ([place]) 10/16/2013  . Ischial bursitis of right side 07/03/2013  . Diastasis recti 10/02/2012  . Depression 07/13/2011    Past Surgical History:  Procedure Laterality Date  . ABDOMINAL HYSTERECTOMY    . BREAST BIOPSY Left    neg  . CHOLECYSTECTOMY    . CORONARY STENT PLACEMENT      OB History   No obstetric history on file.      Home Medications    Prior to Admission medications     Medication Sig Start Date End Date Taking? Authorizing Provider  ALPRAZolam Duanne Moron) 0.5 MG tablet Take 0.5 mg by mouth at bedtime as needed for anxiety.   Yes [provider]  AMBULATORY NON FORMULARY MEDICATION Apply peasized amount nightly for two weeks, then Monday, Wednesday and Friday thereafter 01/08/18  Yes McGowan, Larene Beach A, PA-C  aspirin EC 325 MG tablet Take 325 mg by mouth daily.   Yes [provider]  atenolol (TENORMIN) 50 MG tablet Take 50 mg by mouth daily.    Yes [provider]  cholecalciferol (VITAMIN D) 1000 units tablet Take 4,000 Units by mouth daily.   Yes [provider]  glipiZIDE (GLUCOTROL) 10 MG tablet Take 10 mg by mouth daily before breakfast.    Yes [provider]  hydrochlorothiazide (MICROZIDE) 12.5 MG capsule Take 1 capsule by mouth daily. 08/30/17  Yes [provider]  ibuprofen (ADVIL,MOTRIN) 400 MG tablet Take 1 tablet (400 mg total) by mouth every 6 (six) hours as needed. 03/06/17  Yes Melynda Ripple, MD  lisinopril (PRINIVIL,ZESTRIL) 20 MG tablet Take 20 mg by mouth daily.    Yes [provider]  meloxicam (MOBIC) 7.5 MG tablet Take 1 tablet by mouth daily. 09/22/17  Yes [provider]  metFORMIN (GLUCOPHAGE) 1000 MG tablet Take 1,000 mg by mouth 2 (two) times daily with a meal.  Yes [provider]  NOVOLIN 70/30 (70-30) 100 UNIT/ML injection Inject 14-20 Units into the skin 2 (two) times daily. 14UNITS-AM,  20UNITS-PM 10/31/17  Yes [provider]  sertraline (ZOLOFT) 50 MG tablet Take 1 tablet by mouth daily. 08/30/17  Yes [provider]  simvastatin (ZOCOR) 20 MG tablet Take 20 mg by mouth daily.   Yes [provider]  amoxicillin-clavulanate (AUGMENTIN) 875-125 MG tablet Take 1 tablet by mouth every 12 (twelve) hours. 12/04/17   Zara Council A, PA-C  azithromycin (ZITHROMAX) 250 MG tablet Take 1 tablet (250 mg total) by mouth daily. Take first  2 tablets together, then 1 every day until finished. 02/14/18   Laurene Footman B, PA-C  conjugated estrogens (PREMARIN) vaginal cream Apply 0.5mg  (pea-sized amount)  just inside the vaginal introitus with a finger-tip on  Monday, Wednesday and Friday nights. 11/29/17   Zara Council A, PA-C  estradiol (ESTRACE VAGINAL) 0.1 MG/GM vaginal cream Apply 0.5mg  (pea-sized amount)  just inside the vaginal introitus with a finger-tip on Monday, Wednesday and Friday nights. 11/29/17   McGowan, Hunt Oris, PA-C  methocarbamol (ROBAXIN) 750 MG tablet Take 1 tablet (750 mg total) by mouth every 4 (four) hours. 03/06/17   Melynda Ripple, MD  mirabegron ER (MYRBETRIQ) 50 MG TB24 tablet Take 1 tablet (50 mg total) by mouth daily. 12/29/17   Nori Riis, PA-C    Family History Family History  Problem Relation Age of Onset  . Alzheimer's disease Mother   . Migraines Father   . Alcohol abuse Father   . Breast cancer Neg Hx     Social History Social History   Tobacco Use  . Smoking status: Former Smoker    Quit date: 2005    Years since quitting: 16.0  . Smokeless tobacco: Never Used  Substance Use Topics  . Alcohol use: No  . Drug use: No     Allergies   Patient has no known allergies.   Review of Systems Review of Systems  Respiratory: Positive for cough.      Physical Exam Triage Vital Signs ED Triage Vitals  Enc Vitals Group     BP 02/22/19 1306 (!) 138/57     Pulse Rate 02/22/19 1306 93     Resp 02/22/19 1306 20     Temp 02/22/19 1306 100 F (37.8 C)     Temp Source 02/22/19 1306 Oral     SpO2 02/22/19 1306 92 %     Weight 02/22/19 1257 155 lb (70.3 kg)     Height 02/22/19 1257 5\' 2"  (1.575 m)     Head Circumference --      Peak Flow --      Pain Score 02/22/19 1256 8     Pain Loc --      Pain Edu? --      Excl. in [place]? --    No data found.  Updated Vital Signs BP (!) 138/57 (BP Location: Left Arm)   Pulse 93   Temp 100 F (37.8 C) (Oral)   Resp 20   Ht 5'  2" (1.575 m)   Wt 70.3 kg   SpO2 92%   BMI 28.35 kg/m    Recheck O2 sat= 93-94% RA   Visual Acuity Right Eye Distance:   Left Eye Distance:   Bilateral Distance:    Right Eye Near:   Left Eye Near:    Bilateral Near:     Physical Exam Vitals and nursing note reviewed.  Constitutional:  General: She is not in acute distress.    Appearance: She is not toxic-appearing.  Cardiovascular:     Rate and Rhythm: Normal rate and regular rhythm.  Pulmonary:     Effort: Pulmonary effort is normal. No respiratory distress.  Musculoskeletal:     Right ankle: Swelling present. No ecchymosis or lacerations. Tenderness present over the lateral malleolus and ATF ligament. No CF ligament, posterior TF ligament, base of 5th metatarsal or proximal fibula tenderness. Normal pulse.     Right Achilles Tendon: Normal.  Neurological:     Mental Status: She is alert.      UC Treatments / Results  Labs (all labs ordered are listed, but only abnormal results are displayed) Labs Reviewed  SARS CORONAVIRUS 2 AG (30 MIN TAT) - Abnormal; Notable for the following components:      Result Value   SARS Coronavirus 2 Ag POSITIVE (*)    All other components within normal limits    EKG   Radiology DG Chest 2 View  Result Date: 02/22/2019 CLINICAL DATA:  Patient with recent COVID exposure. Cough and runny nose. Body aches. EXAM: CHEST - 2 VIEW COMPARISON:  Chest radiograph 11/09/2017. FINDINGS: Normal cardiac and mediastinal contours. No consolidative pulmonary opacities. No pleural effusion or pneumothorax. Thoracic spine degenerative changes. IMPRESSION: No acute cardiopulmonary process. Electronically Signed   By: Lovey Newcomer M.D.   On: 02/22/2019 13:34   DG Ankle Complete Right  Result Date: 02/22/2019 CLINICAL DATA:  Right ankle pain for 5 days since a twisting injury. Initial encounter. EXAM: RIGHT ANKLE - COMPLETE 3+ VIEW COMPARISON:  None. FINDINGS: There is no acute bony or joint  abnormality. There is some chondrocalcinosis about the ankle. Dorsal and plantar calcaneal spurs are noted. There is some soft tissue swelling about the lateral aspect of the ankle. IMPRESSION: Lateral soft tissue swelling without underlying acute bony or joint abnormality. Calcaneal spurring. Chondrocalcinosis. Electronically Signed   By: Inge Rise M.D.   On: 02/22/2019 13:40    Procedures Procedures (including critical care time)  Medications Ordered in UC Medications - No data to display  Initial Impression / Assessment and Plan / UC Course  I have reviewed the triage vital signs and the nursing notes.  Pertinent labs & imaging results that were available during my care of the patient were reviewed by me and considered in my medical decision making (see chart for details).      Final Clinical Impressions(s) / UC Diagnoses   Final diagnoses:  [id number] virus infection  Sprain of right ankle, unspecified ligament, initial encounter     Discharge Instructions     Rest, fluids, over the counter medications as needed Follow up with primary care provider     ED Prescriptions    None      1. Lab, x-rays results and diagnosis reviewed with patient 2. Recommend supportive treatment as above and rest, ice, compression, elevation for ankle sprain 3. Follow up with PCP 4. Follow-up prn   PDMP not reviewed this encounter.   Norval Gable, MD 02/22/19 [phone]

## 2019-02-22 NOTE — Discharge Instructions (Addendum)
Rest, fluids, over the counter medications as needed Follow up with primary care provider

## 2019-02-22 NOTE — ED Triage Notes (Signed)
Pt c/o right ankle pain. Started about 5 days ago when she turned her ankle on the porch. She did not fall. She is also having cough, runny nose, body aches, subjective fever and loss of appetite. Started about 5 days ago also. She was exposed to covid two different people.

## 2019-07-29 ENCOUNTER — Other Ambulatory Visit: Payer: Self-pay | Admitting: Orthopedic Surgery

## 2019-07-30 ENCOUNTER — Other Ambulatory Visit: Payer: Self-pay | Admitting: Orthopedic Surgery

## 2019-07-30 DIAGNOSIS — M48061 Spinal stenosis, lumbar region without neurogenic claudication: Secondary | ICD-10-CM

## 2019-07-30 DIAGNOSIS — M4316 Spondylolisthesis, lumbar region: Secondary | ICD-10-CM

## 2019-07-30 DIAGNOSIS — M47816 Spondylosis without myelopathy or radiculopathy, lumbar region: Secondary | ICD-10-CM

## 2019-08-08 ENCOUNTER — Ambulatory Visit
Admission: RE | Admit: 2019-08-08 | Discharge: 2019-08-08 | Disposition: A | Discharge status: Home / Self Care | Payer: Medicare HMO | Source: Ambulatory Visit | Attending: Orthopedic Surgery | Admitting: Orthopedic Surgery

## 2019-08-08 ENCOUNTER — Other Ambulatory Visit: Payer: Self-pay

## 2019-08-08 ENCOUNTER — Encounter (INDEPENDENT_AMBULATORY_CARE_PROVIDER_SITE_OTHER): Payer: Self-pay

## 2019-08-08 ENCOUNTER — Other Ambulatory Visit: Payer: Medicare HMO

## 2019-08-08 DIAGNOSIS — M4316 Spondylolisthesis, lumbar region: Secondary | ICD-10-CM | POA: Insufficient documentation

## 2019-08-08 DIAGNOSIS — M8580 Other specified disorders of bone density and structure, unspecified site: Secondary | ICD-10-CM | POA: Diagnosis present

## 2019-08-08 DIAGNOSIS — M47816 Spondylosis without myelopathy or radiculopathy, lumbar region: Secondary | ICD-10-CM

## 2019-08-08 DIAGNOSIS — M810 Age-related osteoporosis without current pathological fracture: Secondary | ICD-10-CM | POA: Diagnosis not present

## 2019-08-08 DIAGNOSIS — M48061 Spinal stenosis, lumbar region without neurogenic claudication: Secondary | ICD-10-CM | POA: Diagnosis not present

## 2019-08-16 ENCOUNTER — Other Ambulatory Visit: Payer: Self-pay

## 2019-08-16 ENCOUNTER — Ambulatory Visit
Admission: EM | Admit: 2019-08-16 | Discharge: 2019-08-16 | Disposition: A | Discharge status: Home / Self Care | Payer: Medicare HMO | Attending: Family | Admitting: Family

## 2019-08-16 DIAGNOSIS — R1114 Bilious vomiting: Secondary | ICD-10-CM | POA: Diagnosis present

## 2019-08-16 DIAGNOSIS — R82998 Other abnormal findings in urine: Secondary | ICD-10-CM | POA: Diagnosis not present

## 2019-08-16 DIAGNOSIS — G8929 Other chronic pain: Secondary | ICD-10-CM | POA: Diagnosis not present

## 2019-08-16 DIAGNOSIS — M545 Low back pain: Secondary | ICD-10-CM | POA: Insufficient documentation

## 2019-08-16 DIAGNOSIS — R3129 Other microscopic hematuria: Secondary | ICD-10-CM | POA: Diagnosis present

## 2019-08-16 LAB — URINALYSIS, COMPLETE (UACMP) WITH MICROSCOPIC
Bilirubin Urine: NEGATIVE
Glucose, UA: >1000 mg/dL — AB
Ketones, ur: 15 mg/dL — AB
Nitrite: POSITIVE — AB
Protein, ur: NEGATIVE mg/dL
RBC / HPF: NONE SEEN RBC/hpf (ref 0–5)
Specific Gravity, Urine: 1.02 (ref 1.005–1.030)
pH: 5 (ref 5.0–8.0)

## 2019-08-16 MED ORDER — CEFTRIAXONE SODIUM 1 G IJ SOLR
1.0000 g | Freq: Once | INTRAMUSCULAR | Status: AC
  Administered 2019-08-16: 1 g via INTRAMUSCULAR

## 2019-08-16 MED ORDER — KETOROLAC TROMETHAMINE 60 MG/2ML IM SOLN
30.0000 mg | Freq: Once | INTRAMUSCULAR | Status: AC
  Administered 2019-08-16: 30 mg via INTRAMUSCULAR

## 2019-08-16 MED ORDER — CEFDINIR 300 MG PO CAPS: 300.0000 mg | ORAL_CAPSULE | Freq: Two times a day (BID) | ORAL | 0 refills | Status: AC

## 2019-08-16 MED ORDER — ONDANSETRON 8 MG PO TBDP
8.0000 mg | ORAL_TABLET | Freq: Once | ORAL | Status: AC
  Administered 2019-08-16: 8 mg via ORAL

## 2019-08-16 MED ORDER — ONDANSETRON 8 MG PO TBDP
8.0000 mg | ORAL_TABLET | Freq: Three times a day (TID) | ORAL | 0 refills | Status: DC | PRN
Start: 2019-08-16 — End: 2022-04-12

## 2019-08-16 NOTE — Discharge Instructions (Addendum)
You were given a shot Toradol- to help with pain and inflammation in your back. You were also given a shot of an antibiotic (Rocephin) to treat a urinary tract infection/kidney infection which also may be contributing to back pain and vomiting. Use Zofran 8mg  tablet every 8 hours as needed for nausea and vomiting. Continue to push fluids. If pain does not improve and unable to keep down fluids, go to the ER ASAP. Otherwise, follow-up pending urine culture results and with your PCP next week for recheck.

## 2019-08-16 NOTE — ED Triage Notes (Signed)
Pt reports having 3 episodes of emesis this morning. Pt reports having back pain that woke her up. "I think the back pain caused me to vomit".

## 2019-08-18 NOTE — ED Provider Notes (Signed)
[facility]    CSN: [id number] Arrival date & time: 08/16/19  1059      History   Chief Complaint Chief Complaint  Patient presents with  . Emesis    HPI Danielle Vang is a 79 y.o. female.   79 year old female presents with severe low back pain that woke her up in the middle of the night. She also has been nauseous and has vomited 2 times this morning- mostly bile. Denies any distinct fever, abdominal pain, dysuria or unusual vaginal discharge. She has a history of spinal stenosis and chronic back pain and recently had a consult for possible surgery. She has not tried to take any medication for pain- unable to keep fluids down due to emesis. Other chronic health issues include HTN, type 2 DM, CAD, hyperlipidemia, urinary incontinence, and anxiety. Currently on Atenolol, HCTZ, Zocor, aspirin, Glipizide, Metformin, Novolin insulin, Zoloft, Mirabegron, Vit D daily and Xanax prn.   The history is provided by the patient.    Past Medical History:  Diagnosis Date  . Anxiety   . Cancer ([place])    skin ca  . Coronary artery disease   . Diabetes mellitus without complication ([place])   . Hypertension     Patient Active Problem List   Diagnosis Date Noted  . Coronary artery disease 11/29/2017  . DDD (degenerative disc disease), cervical 11/29/2017  . Hyperlipidemia 11/29/2017  . Hypertension 11/29/2017  . Neck pain 11/29/2017  . Osteoporosis 11/29/2017  . Pain in thoracic spine 11/29/2017  . Senile nuclear sclerosis 11/29/2017  . Vertigo, benign positional 11/29/2017  . UTI (urinary tract infection) 11/09/2017  . Spinal stenosis of lumbar region without neurogenic claudication 09/05/2017  . Uncontrolled type 2 diabetes mellitus with circulatory disorder, with long-term current use of insulin ([place]) 10/16/2013  . Ischial bursitis of right side 07/03/2013  . Diastasis recti 10/02/2012  . Depression 07/13/2011    Past Surgical History:  Procedure Laterality Date   . ABDOMINAL HYSTERECTOMY    . BREAST BIOPSY Left    neg  . CHOLECYSTECTOMY    . CORONARY STENT PLACEMENT      OB History   No obstetric history on file.      Home Medications    Prior to Admission medications   Medication Sig Start Date End Date Taking? Authorizing Provider  ALPRAZolam Duanne Moron) 0.5 MG tablet Take 0.5 mg by mouth at bedtime as needed for anxiety.    [provider]  AMBULATORY NON FORMULARY MEDICATION Apply peasized amount nightly for two weeks, then Monday, Wednesday and Friday thereafter 01/08/18   Nori Riis, PA-C  aspirin EC 325 MG tablet Take 325 mg by mouth daily.    [provider]  atenolol (TENORMIN) 50 MG tablet Take 50 mg by mouth daily.     [provider]  cefdinir (OMNICEF) 300 MG capsule Take 1 capsule (300 mg total) by mouth 2 (two) times daily for 7 days. 08/16/19 08/23/19  Katy Apo, NP  cholecalciferol (VITAMIN D) 1000 units tablet Take 4,000 Units by mouth daily.    [provider]  glipiZIDE (GLUCOTROL) 10 MG tablet Take 10 mg by mouth daily before breakfast.     [provider]  hydrochlorothiazide (MICROZIDE) 12.5 MG capsule Take 1 capsule by mouth daily. 08/30/17   [provider]  ibuprofen (ADVIL,MOTRIN) 400 MG tablet Take 1 tablet (400 mg total) by mouth every 6 (six) hours as needed. 03/06/17   Melynda Ripple, MD  meloxicam (MOBIC) 7.5 MG tablet Take 1 tablet by mouth daily. 09/22/17   [provider]  metFORMIN (GLUCOPHAGE) 1000 MG tablet Take 1,000 mg by mouth 2 (two) times daily with a meal.    [provider]  mirabegron ER (MYRBETRIQ) 50 MG TB24 tablet Take 1 tablet (50 mg total) by mouth daily. 12/29/17   McGowan, Larene Beach A, PA-C  NOVOLIN 70/30 (70-30) 100 UNIT/ML injection Inject 14-20 Units into the skin 2 (two) times daily. 14UNITS-AM,  20UNITS-PM 10/31/17   [provider]  ondansetron (ZOFRAN ODT) 8 MG disintegrating tablet Take 1 tablet (8 mg  total) by mouth every 8 (eight) hours as needed for nausea or vomiting. 08/16/19   Katy Apo, NP  sertraline (ZOLOFT) 50 MG tablet Take 1 tablet by mouth daily. 08/30/17   [provider]  simvastatin (ZOCOR) 20 MG tablet Take 20 mg by mouth daily.    [provider]  lisinopril (PRINIVIL,ZESTRIL) 20 MG tablet Take 20 mg by mouth daily.   08/16/19  [provider]    Family History Family History  Problem Relation Age of Onset  . Alzheimer's disease Mother   . Migraines Father   . Alcohol abuse Father   . Breast cancer Neg Hx     Social History Social History   Tobacco Use  . Smoking status: Former Smoker    Quit date: 2005    Years since quitting: 16.5  . Smokeless tobacco: Never Used  Vaping Use  . Vaping Use: Never used  Substance Use Topics  . Alcohol use: No  . Drug use: No     Allergies   Patient has no known allergies.   Review of Systems Review of Systems  Constitutional: Positive for appetite change and fatigue. Negative for activity change, chills, diaphoresis and fever.  Gastrointestinal: Positive for nausea and vomiting. Negative for abdominal pain.  Genitourinary: Positive for frequency. Negative for decreased urine volume, dysuria, flank pain, hematuria, urgency and vaginal discharge.  Musculoskeletal: Positive for arthralgias and back pain. Negative for neck pain and neck stiffness.  Skin: Negative for color change, rash and wound.  Allergic/Immunologic: Negative for environmental allergies, food allergies and immunocompromised state.  Neurological: Negative for dizziness, seizures, syncope, facial asymmetry, weakness, light-headedness and numbness.  Hematological: Negative for adenopathy. Does not bruise/bleed easily.     Physical Exam Triage Vital Signs ED Triage Vitals  Enc Vitals Group     BP 08/16/19 1107 (!) 165/63     Pulse Rate 08/16/19 1107 (!) 57     Resp 08/16/19 1107 18     Temp 08/16/19 1107 98 F (36.7  C)     Temp Source 08/16/19 1107 Oral     SpO2 08/16/19 1107 97 %     Weight 08/16/19 1108 140 lb (63.5 kg)     Height 08/16/19 1108 5\' 2"  (1.575 m)     Head Circumference --      Peak Flow --      Pain Score 08/16/19 1108 10     Pain Loc --      Pain Edu? --      Excl. in [place]? --    No data found.  Updated Vital Signs BP (!) 165/63   Pulse (!) 57   Temp 98 F (36.7 C) (Oral)   Resp 18   Ht 5\' 2"  (1.575 m)   Wt 140 lb (63.5 kg)   SpO2 97%   BMI 25.61 kg/m   Visual Acuity Right  Eye Distance:   Left Eye Distance:   Bilateral Distance:    Right Eye Near:   Left Eye Near:    Bilateral Near:     Physical Exam Vitals and nursing note reviewed.  Constitutional:      General: She is awake. She is not in acute distress.    Appearance: She is well-developed and well-groomed. She is ill-appearing.     Comments: She is sitting in the exam chair in no acute distress but appears nauseous, in pain and had an episode of vomiting while in the exam room.   HENT:     Head: Normocephalic and atraumatic.  Eyes:     Extraocular Movements: Extraocular movements intact.     Conjunctiva/sclera: Conjunctivae normal.  Cardiovascular:     Rate and Rhythm: Regular rhythm. Bradycardia present.     Heart sounds: Normal heart sounds. No murmur heard.   Pulmonary:     Effort: Pulmonary effort is normal. No respiratory distress.     Breath sounds: Normal breath sounds and air entry. No decreased air movement. No decreased breath sounds, wheezing, rhonchi or rales.  Abdominal:     General: Abdomen is flat. Bowel sounds are normal. There is no distension.     Palpations: Abdomen is soft.     Tenderness: There is generalized abdominal tenderness. There is no right CVA tenderness or left CVA tenderness.  Musculoskeletal:        General: Normal range of motion.     Cervical back: Normal range of motion.     Thoracic back: Normal.     Lumbar back: Tenderness present. No swelling or edema. Normal  range of motion.     Comments: Has full range of motion of lower back but pain in central lower lumbar area and slightly tender to palpation. No distinct neuro deficits noted.   Skin:    General: Skin is warm and dry.     Capillary Refill: Capillary refill takes less than 2 seconds.     Findings: No rash.  Neurological:     General: No focal deficit present.     Mental Status: She is alert and oriented to person, place, and time.     Sensory: Sensation is intact. No sensory deficit.     Motor: Motor function is intact.     Gait: Gait is intact.  Psychiatric:        Mood and Affect: Mood is anxious.        Behavior: Behavior normal. Behavior is cooperative.        Thought Content: Thought content normal.        Judgment: Judgment normal.      UC Treatments / Results  Labs (all labs ordered are listed, but only abnormal results are displayed) Labs Reviewed  URINE CULTURE - Abnormal; Notable for the following components:      Result Value   Culture   (*)    Value: >=100,000 COLONIES/mL KLEBSIELLA PNEUMONIAE SUSCEPTIBILITIES TO FOLLOW Performed at [facility], [address], [place], [place] [address]    All other components within normal limits  URINALYSIS, COMPLETE (UACMP) WITH MICROSCOPIC - Abnormal; Notable for the following components:   Glucose, UA >1,000 (*)    Hgb urine dipstick SMALL (*)    Ketones, ur 15 (*)    Nitrite POSITIVE (*)    Leukocytes,Ua SMALL (*)    Bacteria, UA FEW (*)    All other components within normal limits    EKG  Radiology No results found.  Procedures Procedures (including critical care time)  Medications Ordered in UC Medications  ondansetron (ZOFRAN-ODT) disintegrating tablet 8 mg (8 mg Oral Given 08/16/19 1252)  ketorolac (TORADOL) injection 30 mg (30 mg Intramuscular Given 08/16/19 1252)  cefTRIAXone (ROCEPHIN) injection 1 g (1 g Intramuscular Given 08/16/19 1252)    Initial Impression / Assessment and Plan / UC Course  I  have reviewed the triage vital signs and the nursing notes.  Pertinent labs & imaging results that were available during my care of the patient were reviewed by me and considered in my medical decision making (see chart for details).    Discussed with patient that sudden onset of more sever back pain may be due to another etiology besides spinal stenosis. Performed urinalysis which showed positive nitrites, small WBC's and Hgb, few bacteria and positive glucose and ketones- discussed with patient that she may have pyelonephritis, especially with her other symptoms of nausea and vomiting. Gave Zofran 8mg  ODT now to help with nausea. Gave Toradol 30mg  IM now to help with pain. Also started Rocephin 1G IM now for probable UTI/pyelonephritis. Continue Omnicef 300mg  twice a day as directed. Continue Zofran 8mg  every 8 hours as needed for nausea and vomiting. Patient is stable and can be discharged to home but needs close monitoring. Continue to try to push fluids- especially liquids with electrolytes. If pain does not improve or unable to keep down fluids, go to the ER ASAP. Otherwise, follow-up pending urine culture results and with her PCP in 3 days for recheck.  Final Clinical Impressions(s) / UC Diagnoses   Final diagnoses:  Chronic bilateral low back pain, unspecified whether sciatica present  Bilious vomiting with nausea  Urine white blood cells increased  Other microscopic hematuria     Discharge Instructions     You were given a shot Toradol- to help with pain and inflammation in your back. You were also given a shot of an antibiotic (Rocephin) to treat a urinary tract infection/kidney infection which also may be contributing to back pain and vomiting. Use Zofran 8mg  tablet every 8 hours as needed for nausea and vomiting. Continue to push fluids. If pain does not improve and unable to keep down fluids, go to the ER ASAP. Otherwise, follow-up pending urine culture results and with your PCP next  week for recheck.     ED Prescriptions    Medication Sig Dispense Auth. Provider   ondansetron (ZOFRAN ODT) 8 MG disintegrating tablet Take 1 tablet (8 mg total) by mouth every 8 (eight) hours as needed for nausea or vomiting. 12 tablet Smith Mcnicholas, Nicholes Stairs, NP   cefdinir (OMNICEF) 300 MG capsule Take 1 capsule (300 mg total) by mouth 2 (two) times daily for 7 days. 14 capsule Hobert Poplaski, Nicholes Stairs, NP     PDMP not reviewed this encounter.   Katy Apo, NP 08/18/19 2208

## 2019-08-19 LAB — URINE CULTURE: Culture: >=100000 — AB

## 2019-09-13 ENCOUNTER — Other Ambulatory Visit: Payer: Self-pay | Admitting: Student

## 2019-09-13 DIAGNOSIS — Z1231 Encounter for screening mammogram for malignant neoplasm of breast: Secondary | ICD-10-CM

## 2019-10-15 ENCOUNTER — Other Ambulatory Visit: Payer: Self-pay

## 2019-10-15 ENCOUNTER — Ambulatory Visit
Admission: RE | Admit: 2019-10-15 | Discharge: 2019-10-15 | Disposition: A | Discharge status: Home / Self Care | Payer: Medicare HMO | Source: Ambulatory Visit | Attending: Student | Admitting: Student

## 2019-10-15 DIAGNOSIS — Z1231 Encounter for screening mammogram for malignant neoplasm of breast: Secondary | ICD-10-CM | POA: Diagnosis not present

## 2020-04-10 IMAGING — CR DG CHEST 2V
1 series · 2 of 2 positions shown · non-contrast
Comparison: Radiographs August 24, 2016.

CLINICAL DATA: Chills, fever.

EXAM:
CHEST - 2 VIEW

[Series 1: dg chest 2 view · 0.14mm/px · 2 of 2 slices shown]
[im 1/2]
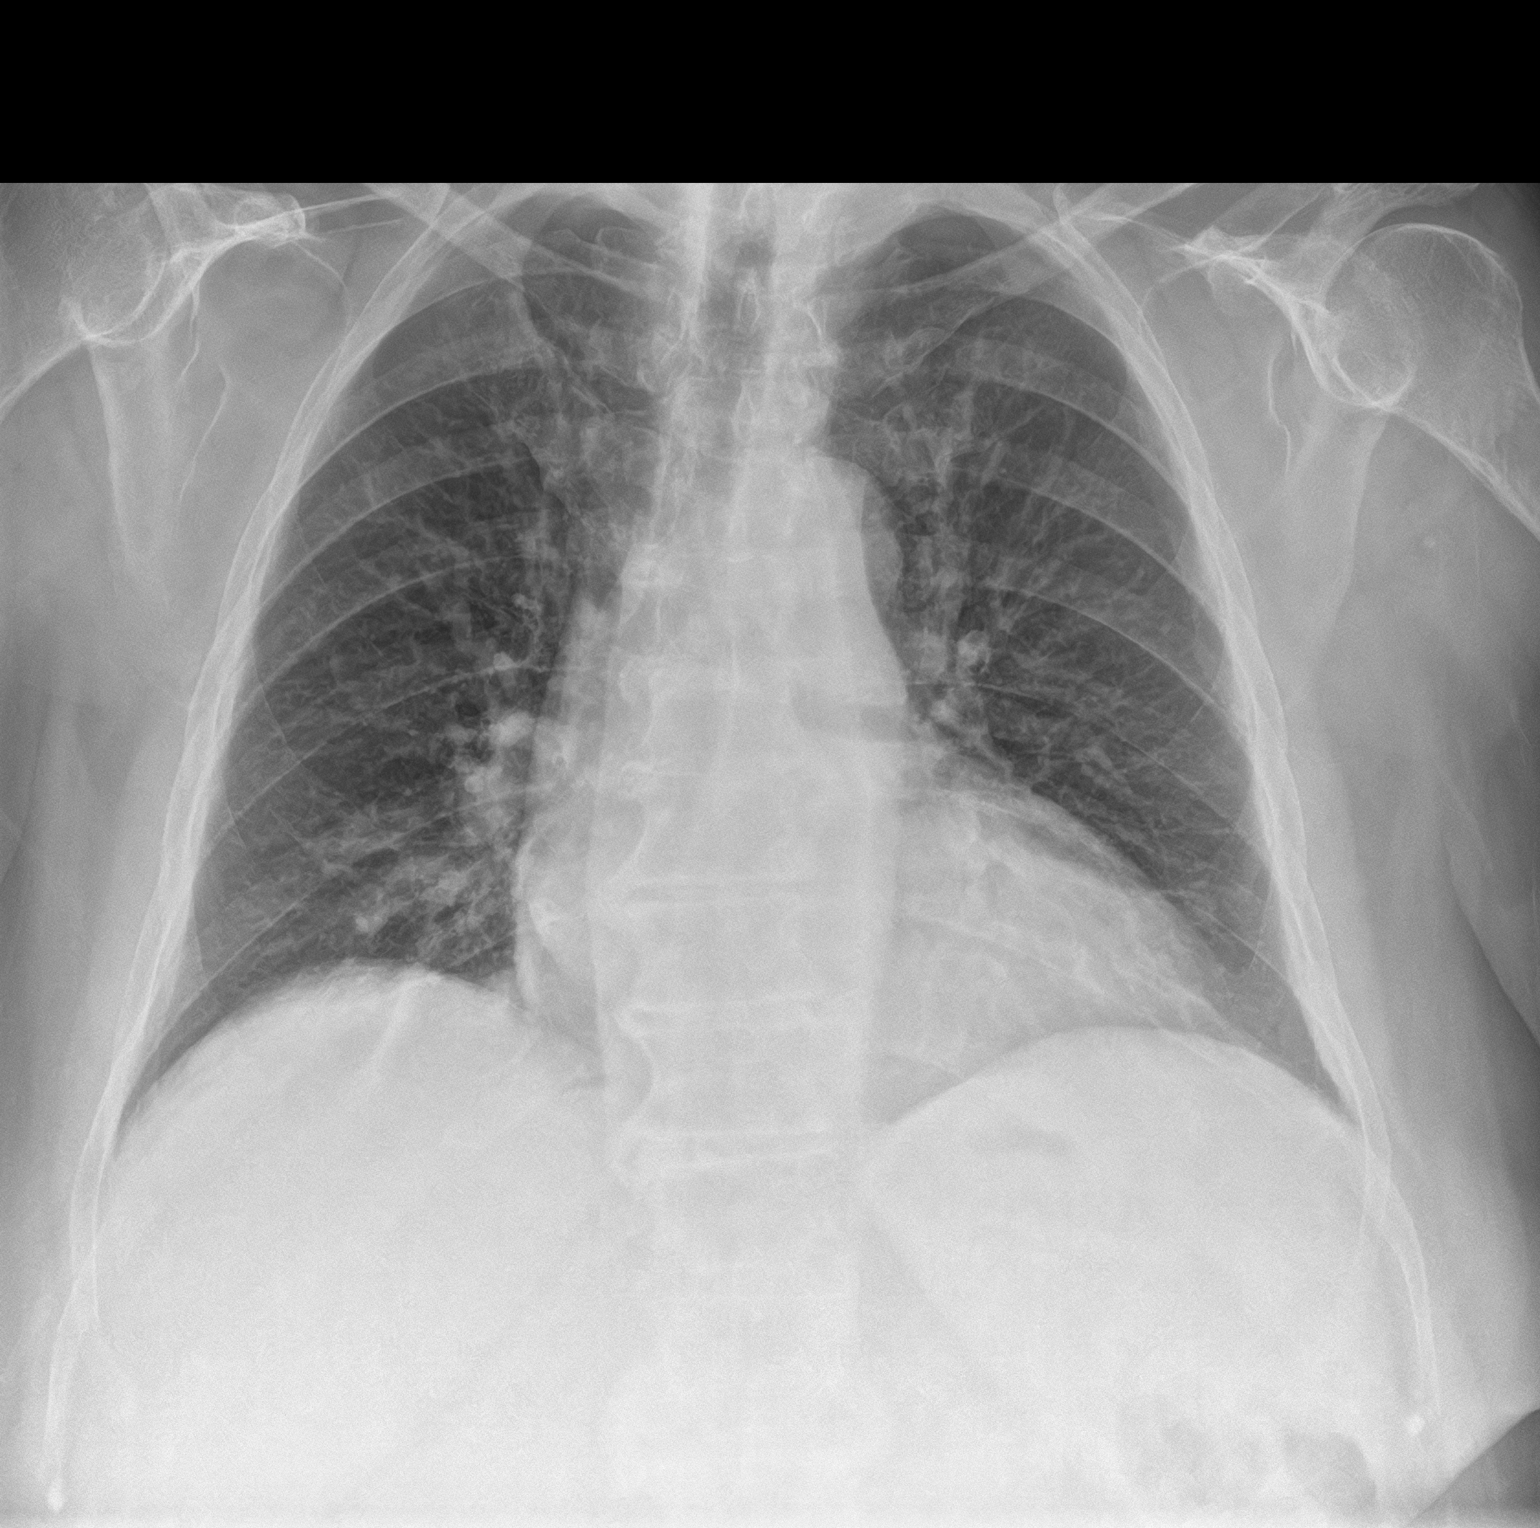
[im 2/2]
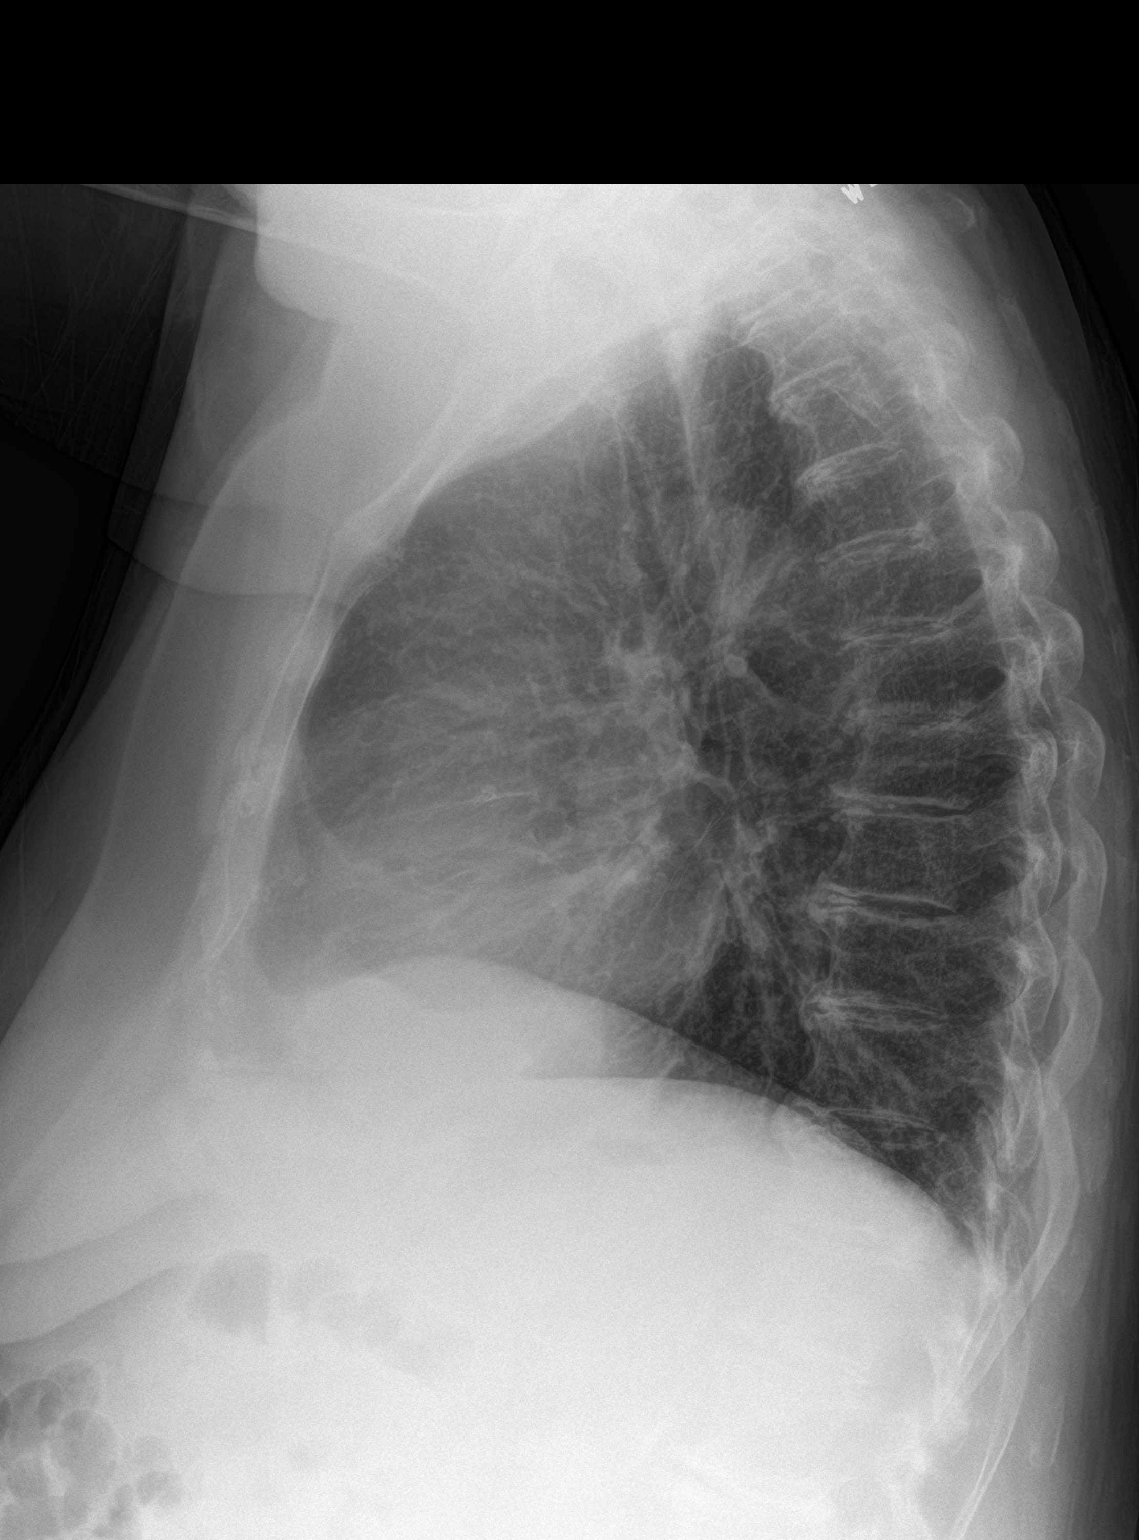

[2 of 2 positions shown; findings below may reference images not displayed]

FINDINGS: The heart size and mediastinal contours are within normal limits.
Both lungs are clear. No pneumothorax or pleural effusion is noted.
The visualized skeletal structures are unremarkable.
IMPRESSION: No active cardiopulmonary disease.

## 2020-10-02 ENCOUNTER — Other Ambulatory Visit: Payer: Self-pay | Admitting: Student

## 2020-10-02 DIAGNOSIS — Z1231 Encounter for screening mammogram for malignant neoplasm of breast: Secondary | ICD-10-CM

## 2020-10-27 ENCOUNTER — Ambulatory Visit
Admission: RE | Admit: 2020-10-27 | Discharge: 2020-10-27 | Disposition: A | Discharge status: Home / Self Care | Payer: Medicare HMO | Source: Ambulatory Visit | Attending: Student | Admitting: Student

## 2020-10-27 ENCOUNTER — Other Ambulatory Visit: Payer: Self-pay

## 2020-10-27 DIAGNOSIS — Z1231 Encounter for screening mammogram for malignant neoplasm of breast: Secondary | ICD-10-CM | POA: Insufficient documentation

## 2022-04-10 ENCOUNTER — Inpatient Hospital Stay
Admission: EM | Admit: 2022-04-10 | Discharge: 2022-05-16 | DRG: 682 | Disposition: E | Payer: Medicare HMO | Source: Skilled Nursing Facility | Attending: Hospitalist | Admitting: Hospitalist

## 2022-04-10 ENCOUNTER — Emergency Department: Payer: Medicare HMO

## 2022-04-10 ENCOUNTER — Other Ambulatory Visit: Payer: Self-pay

## 2022-04-10 DIAGNOSIS — N390 Urinary tract infection, site not specified: Secondary | ICD-10-CM | POA: Diagnosis present

## 2022-04-10 DIAGNOSIS — B9620 Unspecified Escherichia coli [E. coli] as the cause of diseases classified elsewhere: Secondary | ICD-10-CM | POA: Diagnosis present

## 2022-04-10 DIAGNOSIS — R627 Adult failure to thrive: Secondary | ICD-10-CM | POA: Diagnosis present

## 2022-04-10 DIAGNOSIS — G9341 Metabolic encephalopathy: Secondary | ICD-10-CM | POA: Diagnosis not present

## 2022-04-10 DIAGNOSIS — E872 Acidosis, unspecified: Secondary | ICD-10-CM | POA: Diagnosis present

## 2022-04-10 DIAGNOSIS — I129 Hypertensive chronic kidney disease with stage 1 through stage 4 chronic kidney disease, or unspecified chronic kidney disease: Secondary | ICD-10-CM | POA: Diagnosis present

## 2022-04-10 DIAGNOSIS — Z1152 Encounter for screening for COVID-19: Secondary | ICD-10-CM | POA: Diagnosis not present

## 2022-04-10 DIAGNOSIS — Z66 Do not resuscitate: Secondary | ICD-10-CM | POA: Diagnosis not present

## 2022-04-10 DIAGNOSIS — Z90710 Acquired absence of both cervix and uterus: Secondary | ICD-10-CM

## 2022-04-10 DIAGNOSIS — Z515 Encounter for palliative care: Secondary | ICD-10-CM | POA: Diagnosis not present

## 2022-04-10 DIAGNOSIS — E861 Hypovolemia: Secondary | ICD-10-CM | POA: Diagnosis present

## 2022-04-10 DIAGNOSIS — E875 Hyperkalemia: Secondary | ICD-10-CM | POA: Diagnosis present

## 2022-04-10 DIAGNOSIS — E878 Other disorders of electrolyte and fluid balance, not elsewhere classified: Secondary | ICD-10-CM | POA: Diagnosis present

## 2022-04-10 DIAGNOSIS — I959 Hypotension, unspecified: Secondary | ICD-10-CM | POA: Diagnosis present

## 2022-04-10 DIAGNOSIS — Z794 Long term (current) use of insulin: Secondary | ICD-10-CM

## 2022-04-10 DIAGNOSIS — Z79890 Hormone replacement therapy (postmenopausal): Secondary | ICD-10-CM

## 2022-04-10 DIAGNOSIS — Z811 Family history of alcohol abuse and dependence: Secondary | ICD-10-CM

## 2022-04-10 DIAGNOSIS — Z87440 Personal history of urinary (tract) infections: Secondary | ICD-10-CM

## 2022-04-10 DIAGNOSIS — E1165 Type 2 diabetes mellitus with hyperglycemia: Secondary | ICD-10-CM | POA: Diagnosis present

## 2022-04-10 DIAGNOSIS — I10 Essential (primary) hypertension: Secondary | ICD-10-CM | POA: Insufficient documentation

## 2022-04-10 DIAGNOSIS — E785 Hyperlipidemia, unspecified: Secondary | ICD-10-CM | POA: Diagnosis present

## 2022-04-10 DIAGNOSIS — A419 Sepsis, unspecified organism: Secondary | ICD-10-CM | POA: Diagnosis present

## 2022-04-10 DIAGNOSIS — R571 Hypovolemic shock: Secondary | ICD-10-CM | POA: Insufficient documentation

## 2022-04-10 DIAGNOSIS — N179 Acute kidney failure, unspecified: Principal | ICD-10-CM | POA: Diagnosis present

## 2022-04-10 DIAGNOSIS — R54 Age-related physical debility: Secondary | ICD-10-CM | POA: Diagnosis present

## 2022-04-10 DIAGNOSIS — E871 Hypo-osmolality and hyponatremia: Secondary | ICD-10-CM | POA: Diagnosis present

## 2022-04-10 DIAGNOSIS — D72829 Elevated white blood cell count, unspecified: Secondary | ICD-10-CM | POA: Diagnosis present

## 2022-04-10 DIAGNOSIS — F419 Anxiety disorder, unspecified: Secondary | ICD-10-CM | POA: Diagnosis present

## 2022-04-10 DIAGNOSIS — Z85828 Personal history of other malignant neoplasm of skin: Secondary | ICD-10-CM

## 2022-04-10 DIAGNOSIS — D649 Anemia, unspecified: Secondary | ICD-10-CM | POA: Diagnosis present

## 2022-04-10 DIAGNOSIS — Z87442 Personal history of urinary calculi: Secondary | ICD-10-CM

## 2022-04-10 DIAGNOSIS — E119 Type 2 diabetes mellitus without complications: Secondary | ICD-10-CM

## 2022-04-10 DIAGNOSIS — E639 Nutritional deficiency, unspecified: Secondary | ICD-10-CM | POA: Insufficient documentation

## 2022-04-10 DIAGNOSIS — Z9049 Acquired absence of other specified parts of digestive tract: Secondary | ICD-10-CM

## 2022-04-10 DIAGNOSIS — Z6822 Body mass index (BMI) 22.0-22.9, adult: Secondary | ICD-10-CM

## 2022-04-10 DIAGNOSIS — L89159 Pressure ulcer of sacral region, unspecified stage: Secondary | ICD-10-CM | POA: Diagnosis present

## 2022-04-10 DIAGNOSIS — I2510 Atherosclerotic heart disease of native coronary artery without angina pectoris: Secondary | ICD-10-CM | POA: Diagnosis present

## 2022-04-10 DIAGNOSIS — J189 Pneumonia, unspecified organism: Secondary | ICD-10-CM

## 2022-04-10 DIAGNOSIS — I480 Paroxysmal atrial fibrillation: Secondary | ICD-10-CM | POA: Diagnosis present

## 2022-04-10 DIAGNOSIS — Z820 Family history of epilepsy and other diseases of the nervous system: Secondary | ICD-10-CM

## 2022-04-10 DIAGNOSIS — E860 Dehydration: Secondary | ICD-10-CM | POA: Diagnosis present

## 2022-04-10 DIAGNOSIS — Z87891 Personal history of nicotine dependence: Secondary | ICD-10-CM

## 2022-04-10 DIAGNOSIS — E43 Unspecified severe protein-calorie malnutrition: Secondary | ICD-10-CM | POA: Insufficient documentation

## 2022-04-10 DIAGNOSIS — R7989 Other specified abnormal findings of blood chemistry: Secondary | ICD-10-CM | POA: Diagnosis present

## 2022-04-10 DIAGNOSIS — Z7901 Long term (current) use of anticoagulants: Secondary | ICD-10-CM

## 2022-04-10 DIAGNOSIS — D75839 Thrombocytosis, unspecified: Secondary | ICD-10-CM | POA: Diagnosis present

## 2022-04-10 DIAGNOSIS — Z7189 Other specified counseling: Secondary | ICD-10-CM | POA: Diagnosis not present

## 2022-04-10 DIAGNOSIS — R400 Somnolence: Secondary | ICD-10-CM | POA: Diagnosis present

## 2022-04-10 DIAGNOSIS — Z955 Presence of coronary angioplasty implant and graft: Secondary | ICD-10-CM

## 2022-04-10 DIAGNOSIS — R0682 Tachypnea, not elsewhere classified: Secondary | ICD-10-CM | POA: Diagnosis present

## 2022-04-10 DIAGNOSIS — Z79899 Other long term (current) drug therapy: Secondary | ICD-10-CM

## 2022-04-10 DIAGNOSIS — I493 Ventricular premature depolarization: Secondary | ICD-10-CM | POA: Diagnosis present

## 2022-04-10 LAB — COMPREHENSIVE METABOLIC PANEL
ALT: 26 U/L (ref 0–44)
AST: 54 U/L — ABNORMAL HIGH (ref 15–41)
Albumin: 1.9 g/dL — ABNORMAL LOW (ref 3.5–5.0)
Alkaline Phosphatase: 118 U/L (ref 38–126)
Anion gap: 12 (ref 5–15)
BUN: 71 mg/dL — ABNORMAL HIGH (ref 8–23)
CO2: 21 mmol/L — ABNORMAL LOW (ref 22–32)
Calcium: 9.9 mg/dL (ref 8.9–10.3)
Chloride: 94 mmol/L — ABNORMAL LOW (ref 98–111)
Creatinine, Ser: 2.67 mg/dL — ABNORMAL HIGH (ref 0.44–1.00)
GFR, Estimated: 17 mL/min — ABNORMAL LOW (ref >60)
Glucose, Bld: 163 mg/dL — ABNORMAL HIGH (ref 70–99)
Potassium: 4.4 mmol/L (ref 3.5–5.1)
Sodium: 127 mmol/L — ABNORMAL LOW (ref 135–145)
Total Bilirubin: 0.6 mg/dL (ref 0.3–1.2)
Total Protein: 6.3 g/dL — ABNORMAL LOW (ref 6.5–8.1)

## 2022-04-10 LAB — CBC WITH DIFFERENTIAL/PLATELET
Abs Immature Granulocytes: 0.24 10*3/uL — ABNORMAL HIGH (ref 0.00–0.07)
Basophils Absolute: 0 10*3/uL (ref 0.0–0.1)
Basophils Relative: 0 %
Eosinophils Absolute: 0.1 10*3/uL (ref 0.0–0.5)
Eosinophils Relative: 1 %
HCT: 33.2 % — ABNORMAL LOW (ref 36.0–46.0)
Hemoglobin: 10.4 g/dL — ABNORMAL LOW (ref 12.0–15.0)
Immature Granulocytes: 2 %
Lymphocytes Relative: 11 %
Lymphs Abs: 1.3 10*3/uL (ref 0.7–4.0)
MCH: 26.9 pg (ref 26.0–34.0)
MCHC: 31.3 g/dL (ref 30.0–36.0)
MCV: 85.8 fL (ref 80.0–100.0)
Monocytes Absolute: 0.7 10*3/uL (ref 0.1–1.0)
Monocytes Relative: 6 %
Neutro Abs: 9.1 10*3/uL — ABNORMAL HIGH (ref 1.7–7.7)
Neutrophils Relative %: 80 %
Platelets: 415 10*3/uL — ABNORMAL HIGH (ref 150–400)
RBC: 3.87 MIL/uL (ref 3.87–5.11)
RDW: 18 % — ABNORMAL HIGH (ref 11.5–15.5)
WBC: 11.5 10*3/uL — ABNORMAL HIGH (ref 4.0–10.5)
nRBC: 0 % (ref 0.0–0.2)

## 2022-04-10 LAB — URINALYSIS, W/ REFLEX TO CULTURE (INFECTION SUSPECTED)
Bilirubin Urine: NEGATIVE
Glucose, UA: 50 mg/dL — AB
Ketones, ur: NEGATIVE mg/dL
Nitrite: NEGATIVE
Protein, ur: 100 mg/dL — AB
RBC / HPF: >50 RBC/hpf (ref 0–5)
Specific Gravity, Urine: 1.01 (ref 1.005–1.030)
Squamous Epithelial / HPF: NONE SEEN /HPF (ref 0–5)
WBC, UA: >50 WBC/hpf (ref 0–5)
pH: 6 (ref 5.0–8.0)

## 2022-04-10 LAB — PROTIME-INR
INR: 1.8 — ABNORMAL HIGH (ref 0.8–1.2)
Prothrombin Time: 20.4 seconds — ABNORMAL HIGH (ref 11.4–15.2)

## 2022-04-10 LAB — LACTIC ACID, PLASMA: Lactic Acid, Venous: 2.7 mmol/L (ref 0.5–1.9)

## 2022-04-10 LAB — RESP PANEL BY RT-PCR (RSV, FLU A&B, COVID)  RVPGX2
Influenza A by PCR: NEGATIVE
Influenza B by PCR: NEGATIVE
Resp Syncytial Virus by PCR: NEGATIVE
SARS Coronavirus 2 by RT PCR: NEGATIVE

## 2022-04-10 LAB — APTT: aPTT: 38 seconds — ABNORMAL HIGH (ref 24–36)

## 2022-04-10 LAB — MAGNESIUM: Magnesium: 1.7 mg/dL (ref 1.7–2.4)

## 2022-04-10 MED ORDER — LACTATED RINGERS IV SOLN
150.0000 mL/h | INTRAVENOUS | Status: AC
  Administered 2022-04-11 (×2): 150 mL/h via INTRAVENOUS

## 2022-04-10 MED ORDER — ATORVASTATIN CALCIUM 20 MG PO TABS
20.0000 mg | ORAL_TABLET | Freq: Every day | ORAL | Status: DC
  Administered 2022-04-11 – 2022-04-12 (×2): 20 mg via ORAL
  Filled 2022-04-10 (×3): qty 1

## 2022-04-10 MED ORDER — TRAZODONE HCL 50 MG PO TABS: 25.0000 mg | ORAL_TABLET | Freq: Every evening | ORAL | Status: DC | PRN

## 2022-04-10 MED ORDER — MELATONIN 5 MG PO TABS
2.5000 mg | ORAL_TABLET | Freq: Every day | ORAL | Status: DC
  Filled 2022-04-10 (×2): qty 1

## 2022-04-10 MED ORDER — INSULIN GLARGINE-YFGN 100 UNIT/ML ~~LOC~~ SOLN
5.0000 [IU] | Freq: Two times a day (BID) | SUBCUTANEOUS | Status: DC
  Administered 2022-04-11: 5 [IU] via SUBCUTANEOUS
  Filled 2022-04-10 (×3): qty 0.05

## 2022-04-10 MED ORDER — ADULT MULTIVITAMIN W/MINERALS CH
1.0000 | ORAL_TABLET | Freq: Every day | ORAL | Status: DC
  Administered 2022-04-11 – 2022-04-12 (×2): 1 via ORAL
  Filled 2022-04-10 (×3): qty 1

## 2022-04-10 MED ORDER — SODIUM CHLORIDE 0.9 % IV SOLN
2.0000 g | Freq: Once | INTRAVENOUS | Status: AC
  Administered 2022-04-10: 2 g via INTRAVENOUS
  Filled 2022-04-10: qty 12.5

## 2022-04-10 MED ORDER — ATENOLOL 25 MG PO TABS: 50.0000 mg | ORAL_TABLET | Freq: Every day | ORAL | Status: DC

## 2022-04-10 MED ORDER — SODIUM CHLORIDE 0.9 % IV SOLN
500.0000 mg | INTRAVENOUS | Status: DC
  Administered 2022-04-11 – 2022-04-12 (×3): 500 mg via INTRAVENOUS
  Filled 2022-04-10 (×2): qty 500
  Filled 2022-04-10: qty 5

## 2022-04-10 MED ORDER — SODIUM CHLORIDE 0.9 % IV SOLN
2.0000 g | INTRAVENOUS | Status: DC
  Administered 2022-04-11 – 2022-04-12 (×2): 2 g via INTRAVENOUS
  Filled 2022-04-10 (×2): qty 2

## 2022-04-10 MED ORDER — RIVAROXABAN 15 MG PO TABS
15.0000 mg | ORAL_TABLET | Freq: Every day | ORAL | Status: DC
  Administered 2022-04-11 – 2022-04-12 (×2): 15 mg via ORAL
  Filled 2022-04-10 (×2): qty 1

## 2022-04-10 MED ORDER — INSULIN ASPART 100 UNIT/ML IJ SOLN
0.0000 [IU] | Freq: Three times a day (TID) | INTRAMUSCULAR | Status: DC
  Administered 2022-04-11: 3 [IU] via SUBCUTANEOUS
  Filled 2022-04-10: qty 1

## 2022-04-10 MED ORDER — ONDANSETRON HCL 4 MG/2ML IJ SOLN: 4.0000 mg | Freq: Four times a day (QID) | INTRAMUSCULAR | Status: DC | PRN

## 2022-04-10 MED ORDER — MAGNESIUM HYDROXIDE 400 MG/5ML PO SUSP: 30.0000 mL | Freq: Every day | ORAL | Status: DC | PRN

## 2022-04-10 MED ORDER — ONDANSETRON HCL 4 MG PO TABS: 4.0000 mg | ORAL_TABLET | Freq: Four times a day (QID) | ORAL | Status: DC | PRN

## 2022-04-10 MED ORDER — METRONIDAZOLE 500 MG/100ML IV SOLN
500.0000 mg | Freq: Once | INTRAVENOUS | Status: AC
  Administered 2022-04-10: 500 mg via INTRAVENOUS
  Filled 2022-04-10: qty 100

## 2022-04-10 MED ORDER — SODIUM CHLORIDE 0.9 % IV BOLUS
2000.0000 mL | Freq: Once | INTRAVENOUS | Status: AC
  Administered 2022-04-10: 2000 mL via INTRAVENOUS

## 2022-04-10 MED ORDER — ACETAMINOPHEN 650 MG RE SUPP: 650.0000 mg | Freq: Four times a day (QID) | RECTAL | Status: DC | PRN

## 2022-04-10 MED ORDER — ASPIRIN 325 MG PO TBEC: 325.0000 mg | DELAYED_RELEASE_TABLET | Freq: Every day | ORAL | Status: DC

## 2022-04-10 MED ORDER — FOLIC ACID 1 MG PO TABS
500.0000 ug | ORAL_TABLET | Freq: Every day | ORAL | Status: DC
  Administered 2022-04-11 – 2022-04-12 (×2): 0.5 mg via ORAL
  Filled 2022-04-10 (×3): qty 1

## 2022-04-10 MED ORDER — VANCOMYCIN HCL IN DEXTROSE 1-5 GM/200ML-% IV SOLN
1000.0000 mg | Freq: Once | INTRAVENOUS | Status: AC
  Administered 2022-04-10: 1000 mg via INTRAVENOUS
  Filled 2022-04-10: qty 200

## 2022-04-10 MED ORDER — SERTRALINE HCL 50 MG PO TABS
50.0000 mg | ORAL_TABLET | Freq: Every day | ORAL | Status: DC
  Administered 2022-04-11 – 2022-04-14 (×3): 50 mg via ORAL
  Filled 2022-04-10 (×4): qty 1

## 2022-04-10 MED ORDER — ACETAMINOPHEN 325 MG PO TABS
650.0000 mg | ORAL_TABLET | Freq: Four times a day (QID) | ORAL | Status: DC | PRN
  Administered 2022-04-11: 650 mg via ORAL
  Filled 2022-04-10: qty 2

## 2022-04-10 NOTE — Assessment & Plan Note (Signed)
-   She is really on the verge of sepsis given tachypnea and leukocytosis though it is less than 12 however she has elevated lactic acid of 2.7 and hypotension in addition to AKI that is concerning for developing severe sepsis. - She will be admitted to a medical telemetry bed. - We will continue antibiotic therapy with IV Rocephin and given the possibility of atypical infection on chest x-ray will add Zithromax. - We will follow blood and urine cultures. - She will be hydrated with IV LR

## 2022-04-10 NOTE — Assessment & Plan Note (Signed)
-   This is likely. Due to volume depletion and dehydration. - Will continue hydration with IV lactated Ringer and follow BMP. - Would avoid nephrotoxins.

## 2022-04-10 NOTE — ED Triage Notes (Signed)
Pt coming from [facility] via [facility]. Pt had urine culture Friday, found to have UTI, started IM abx for it and has received two doses. Pt's lab work was off and the facility called to transport pt to hospital. Pt is lethargic and unable to answer all questions.

## 2022-04-10 NOTE — Assessment & Plan Note (Signed)
-   We will continue Xarelto. ?

## 2022-04-10 NOTE — Assessment & Plan Note (Signed)
-   The patient will be placed on supplemental coverage with NovoLog. - We will continue her basal coverage.

## 2022-04-10 NOTE — ED Provider Notes (Signed)
[facility] Provider Note    Event Date/Time   First MD Initiated Contact with Patient 04/04/2022 1849     (approximate)   History   Blood Infection   HPI  Danielle Vang is a 82 y.o. female   Past medical history of skin cancer, CAD, diabetes, hypertension and anxiety frequent UTIs who presents emergency department from her nursing care facility with concerns for sepsis.  She was diagnosed with urinary tract infection 2 days ago and started on antibiotics but has worsened clinically with more lethargy fatigue tachypnea and had labs done which were concerning for multiple lab abnormalities including elevated creatinine, hyperkalemia, hyperglycemia.  The patient is somnolent, appears dehydrated, but answers questions appropriately.  She denies respiratory infectious symptoms and her only complaint at this time is some irritation to her sacral area with a known decubitus ulcer does not appear grossly infected.  She specifically denies chest pain, abdominal pain, dysuria or frequency.  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  Creatinine down trended after IV fluids and p.o. intake and was down to 1.69 at the time of discharge she was also diagnosed with C. difficile at that time and treated.      Physical Exam   Triage Vital Signs: ED Triage Vitals  Enc Vitals Group     BP      Pulse      Resp      Temp      Temp src      SpO2      Weight      Height      Head Circumference      Peak Flow      Pain Score      Pain Loc      Pain Edu?      Excl. in [place]?     Most recent vital signs: Vitals:   03/31/2022 2100 04/09/2022 2130  BP: (!) 99/48 (!)  98/46  Pulse: 67 66  Resp: 13 19  SpO2: 99% 97%    General: Awake, answering questions. CV:  Good peripheral perfusion.  Resp:  Normal effort.  Abd:  No distention.  Other:  Ill-appearing, dry mucous membranes, tachypneic with respiratory rate of 25, blood pressure is 100/60 with a normal heart rate, no hypoxemia.  Abdomen is soft and nontender to palpation all quadrants.  There is a sacral decubitus ulcer that does not appear grossly infected.  She is moving all extremities.  Lungs are clear with no focality or wheezing.   ED Results / Procedures / Treatments   Labs (all labs ordered are listed, but only abnormal results are displayed) Labs Reviewed  LACTIC ACID, PLASMA - Abnormal; Notable for the following components:      Result Value   Lactic Acid, Venous 2.7 (*)    All other components within normal limits  COMPREHENSIVE METABOLIC PANEL - Abnormal; Notable for the following components:   Sodium 127 (*)    Chloride 94 (*)    CO2 21 (*)    Glucose, Bld 163 (*)    BUN 71 (*)    Creatinine, Ser 2.67 (*)    Total Protein 6.3 (*)    Albumin 1.9 (*)    AST 54 (*)  GFR, Estimated 17 (*)    All other components within normal limits  CBC WITH DIFFERENTIAL/PLATELET - Abnormal; Notable for the following components:   WBC 11.5 (*)    Hemoglobin 10.4 (*)    HCT 33.2 (*)    RDW 18.0 (*)    Platelets 415 (*)    Neutro Abs 9.1 (*)    Abs Immature Granulocytes 0.24 (*)    All other components within normal limits  URINALYSIS, W/ REFLEX TO CULTURE (INFECTION SUSPECTED) - Abnormal; Notable for the following components:   Color, Urine YELLOW (*)    APPearance TURBID (*)    Glucose, UA 50 (*)    Hgb urine dipstick LARGE (*)    Protein, ur 100 (*)    Leukocytes,Ua LARGE (*)    Bacteria, UA FEW (*)    All other components within normal limits  CULTURE, BLOOD (ROUTINE X 2)  CULTURE, BLOOD (ROUTINE X 2)  RESP PANEL BY RT-PCR (RSV, FLU A&B, COVID)  [id number]  URINE CULTURE  MAGNESIUM   LACTIC ACID, PLASMA  PROTIME-INR  APTT     I ordered and reviewed the above labs they are notable for elevated creatinine but normal potassium.  White blood cell count is elevated 11.5 and lactic acidosis.  Urine appears infected with leukocytes and bacteria.  EKG  ED ECG REPORT I, Lucillie Garfinkel, the attending physician, personally viewed and interpreted this ECG.   Date: 03/27/2022  EKG Time: 1906  Rate: 74  Rhythm: sinus  Axis: nl  Intervals:occasional PVC, long qtc 516  ST&T Change: no acute ischemic changes2    RADIOLOGY I independently reviewed and interpreted chest x-ray see no obvious focalities or pneumothorax   PROCEDURES:  Critical Care performed: Yes, see critical care procedure note(s)  .Critical Care  Performed by: Lucillie Garfinkel, MD Authorized by: Lucillie Garfinkel, MD   Critical care provider statement:    Critical care time (minutes):  30   Critical care was time spent personally by me on the following activities:  Development of treatment plan with patient or surrogate, discussions with consultants, evaluation of patient's response to treatment, examination of patient, ordering and review of laboratory studies, ordering and review of radiographic studies, ordering and performing treatments and interventions, pulse oximetry, re-evaluation of patient's condition and review of old charts    MEDICATIONS ORDERED IN ED: Medications  vancomycin (VANCOCIN) IVPB 1000 mg/200 mL premix (1,000 mg Intravenous New Bag/Given 03/30/2022 2154)  sodium chloride 0.9 % bolus 2,000 mL (0 mLs Intravenous Stopped 04/08/2022 2123)  ceFEPIme (MAXIPIME) 2 g in sodium chloride 0.9 % 100 mL IVPB (0 g Intravenous Stopped 03/30/2022 2123)  metroNIDAZOLE (FLAGYL) IVPB 500 mg (0 mg Intravenous Stopped 04/05/2022 2233)    External physician / consultants:  I spoke with hospitalist for admission & regarding care plan for this patient.   IMPRESSION / MDM / ASSESSMENT AND PLAN / ED COURSE  I reviewed the  triage vital signs and the nursing notes.                                Patient's presentation is most consistent with acute presentation with potential threat to life or bodily function.  Differential diagnosis includes, but is not limited to, sepsis, urinary tract infection, respiratory infection, metabolic derangements, AKI or dehydration   The patient is on the cardiac monitor to evaluate for evidence of arrhythmia and/or significant heart rate changes.  MDM: Patient with sepsis  so given a sepsis bolus 30 cc/kg ideal body weight upon arrival as well as broad-spectrum antibiotics.  Most likely source is urine given recent urine infection and did her urinalysis appears infected.  There is also evidence of focal consolidation on chest x-ray concerning for pneumonia as well.  She has a lactic acidosis and white blood cell count elevation.   Sepsis reevaluation -patient seems to be more alert and awake.  Vital signs significant for blood pressure 90s over 40s, heart rate 66 and respiratory rate improved to 19.        FINAL CLINICAL IMPRESSION(S) / ED DIAGNOSES   Final diagnoses:  Sepsis, due to unspecified organism, unspecified whether acute organ dysfunction present ([place])  AKI (acute kidney injury) ([place])  Urinary tract infection without hematuria, site unspecified     Rx / DC Orders   ED Discharge Orders     None        Note:  This document was prepared using Dragon voice recognition software and may include unintentional dictation errors.    Lucillie Garfinkel, MD 04/01/2022 2236

## 2022-04-10 NOTE — Assessment & Plan Note (Signed)
-   We will continue her antihypertensives. 

## 2022-04-10 NOTE — Assessment & Plan Note (Signed)
-   We will continue statin therapy. 

## 2022-04-10 NOTE — Assessment & Plan Note (Signed)
-   This is concerning for atypical infection and therefore she will be placed on IV Rocephin and Zithromax. - It could have been contributing to her lethargy and tachypnea.

## 2022-04-10 NOTE — H&P (Signed)
[place]   PATIENT NAME: Danielle Vang    MR#:  [id number]  DATE OF BIRTH:  1940-03-23  DATE OF ADMISSION:  04/05/2022  PRIMARY CARE PHYSICIAN: Gustavo Lah, MD   Patient is coming from: SNF  REQUESTING/REFERRING PHYSICIAN: Lucillie Garfinkel, MD  CHIEF COMPLAINT:   Chief Complaint  Patient presents with   Blood Infection    HISTORY OF PRESENT ILLNESS:  Danielle Vang is a 82 y.o. Caucasian female with medical history significant for type 2 diabetes mellitus, hypertension, coronary artery disease paroxysmal atrial fibrillation and anxiety, who presented to the ER with acute onset of suspected sepsis from her SNF.  The patient is a history of recurrent UTIs and was diagnosed with 1 a couple days ago and was started on antibiotics.  Despite that she became more lethargic and fatigued and was noted to have tachypnea and had labs that were concerning for elevated creatinine and potassium as well as hyperglycemia.  She admits to urinary urgency and dysuria without significant frequency.  No chest pain or palpitations.  No significant cough or wheezing or dyspnea.  No reported fever or chills.  She was fairly somnolent but cooperative with questions.  No reported nausea or vomiting or abdominal pain.   ED Course: When she came to the ER, respiratory rate was 25 with otherwise normal vital signs.  Later on BP was down to 98/46 with a MAP of 62.  This later came up to 111/63 with a MAP of 76 with hydration.  Labs revealed leukocytosis of 11.5 with neutrophilia as well as thrombocytosis, hyponatremia and hypochloremia with a CO2 of 21 and glucose of 163, BUN of 71 and creatinine 2.67 with albumin 1.9 and ALT 54 with total protein of 6.3.  Lactic acid was 2.7 and INR 1.8 with PT 20.4 and PTT 38. EKG as reviewed by me : Sinus rhythm with a rate of 74 with PACs, PVCs, prolonged QT interval with QTc of 516 MS. Imaging: Portable chest x-ray showed interval increase in prominence  of bilateral interstitial opacities that could suggest pulmonary venous congestion or atypical infection.  The patient was given broad-spectrum antibiotic therapy with IV cefepime, vancomycin and Flagyl.  She will be admitted to a medical telemetry bed for further evaluation and management. PAST MEDICAL HISTORY:   Past Medical History:  Diagnosis Date   Anxiety    Cancer ([place])    skin ca   Coronary artery disease    Diabetes mellitus without complication (HCC)    Hypertension   -Paroxysmal atrial fibrillation, HOCM, history of C. difficile, DDD, BPV, history of CKD and urolithiasis. PAST SURGICAL HISTORY:   Past Surgical History:  Procedure Laterality Date   ABDOMINAL HYSTERECTOMY     BREAST BIOPSY Left    neg   CHOLECYSTECTOMY     CORONARY STENT PLACEMENT      SOCIAL HISTORY:   Social History   Tobacco Use   Smoking status: Former    Types: Cigarettes    Quit date: 2005    Years since quitting: 19.1   Smokeless tobacco: Never  Substance Use Topics   Alcohol use: No    FAMILY HISTORY:   Family History  Problem Relation Age of Onset   Alzheimer's disease Mother    Migraines Father    Alcohol abuse Father    Breast cancer Neg Hx     DRUG ALLERGIES:  No Known Allergies  REVIEW OF SYSTEMS:   ROS As per history of  present illness. All pertinent systems were reviewed above. Constitutional, HEENT, cardiovascular, respiratory, GI, GU, musculoskeletal, neuro, psychiatric, endocrine, integumentary and hematologic systems were reviewed and are otherwise negative/unremarkable except for positive findings mentioned above in the HPI.   MEDICATIONS AT HOME:   Prior to Admission medications   Medication Sig Start Date End Date Taking? Authorizing Provider  atorvastatin (LIPITOR) 20 MG tablet Take 20 mg by mouth daily.   Yes [provider]  conjugated estrogens (PREMARIN) vaginal cream Place 1 applicator vaginally daily.   Yes [provider]   ertapenem (INVANZ) IVPB 500 mg daily. IM inj   Yes [provider]  folic acid (FOLVITE) A999333 MCG tablet Take 400 mcg by mouth daily.   Yes [provider]  insulin glargine (LANTUS) 100 UNIT/ML injection Inject 5 Units into the skin 2 (two) times daily.   Yes [provider]  insulin lispro (HUMALOG) 100 UNIT/ML injection Inject 1-8 Units into the skin in the morning, at noon, in the evening, and at bedtime. Per sliding scale 160-200 = 1 units 201-240= 2 units 241-280=3 units 281-300= 4 units 301-350=6 units 351-400=8 units  Before meals and at bedtime   Yes [provider]  melatonin 3 MG TABS tablet Take 3 mg by mouth at bedtime.   Yes [provider]  Multiple Vitamin (MULTIVITAMIN WITH MINERALS) TABS tablet Take 1 tablet by mouth daily.   Yes [provider]  Rivaroxaban (XARELTO) 15 MG TABS tablet Take 15 mg by mouth daily with supper.   Yes [provider]  sertraline (ZOLOFT) 50 MG tablet Take 1 tablet by mouth daily. 08/30/17  Yes [provider]  ALPRAZolam Duanne Moron) 0.5 MG tablet Take 0.5 mg by mouth at bedtime as needed for anxiety. Patient not taking: Reported on 04/11/2022    [provider]  [place] peasized amount nightly for two weeks, then Monday, Wednesday and Friday thereafter Patient not taking: Reported on 04/12/2022 01/08/18   Nori Riis, PA-C  aspirin EC 325 MG tablet Take 325 mg by mouth daily. Patient not taking: Reported on 03/23/2022    [provider]  atenolol (TENORMIN) 50 MG tablet Take 50 mg by mouth daily.  Patient not taking: Reported on 04/05/2022    [provider]  cholecalciferol (VITAMIN D) 1000 units tablet Take 4,000 Units by mouth daily. Patient not taking: Reported on 03/23/2022    [provider]  glipiZIDE (GLUCOTROL) 10 MG tablet Take 10 mg by mouth daily before breakfast.  Patient not taking: Reported  on 04/07/2022    [provider]  hydrochlorothiazide (MICROZIDE) 12.5 MG capsule Take 1 capsule by mouth daily. Patient not taking: Reported on 03/23/2022 08/30/17   [provider]  ibuprofen (ADVIL,MOTRIN) 400 MG tablet Take 1 tablet (400 mg total) by mouth every 6 (six) hours as needed. Patient not taking: Reported on 04/09/2022 03/06/17   Melynda Ripple, MD  meloxicam (MOBIC) 7.5 MG tablet Take 1 tablet by mouth daily. Patient not taking: Reported on 03/23/2022 09/22/17   [provider]  metFORMIN (GLUCOPHAGE) 1000 MG tablet Take 1,000 mg by mouth 2 (two) times daily with a meal. Patient not taking: Reported on 03/20/2022    [provider]  mirabegron ER (MYRBETRIQ) 50 MG TB24 tablet Take 1 tablet (50 mg total) by mouth daily. Patient not taking: Reported on 03/28/2022 12/29/17   Zara Council A, PA-C  NOVOLIN 70/30 (70-30) 100 UNIT/ML injection Inject 14-20 Units into the skin  2 (two) times daily. 14UNITS-AM,  20UNITS-PM Patient not taking: Reported on 04/01/2022 10/31/17   [provider]  ondansetron (ZOFRAN ODT) 8 MG disintegrating tablet Take 1 tablet (8 mg total) by mouth every 8 (eight) hours as needed for nausea or vomiting. Patient not taking: Reported on 04/02/2022 08/16/19   Katy Apo, NP  simvastatin (ZOCOR) 20 MG tablet Take 20 mg by mouth daily. Patient not taking: Reported on 03/17/2022    [provider]  lisinopril (PRINIVIL,ZESTRIL) 20 MG tablet Take 20 mg by mouth daily.   08/16/19  [provider]      VITAL SIGNS:  Blood pressure (!) 98/46, pulse 66, temperature (!) 97.4 F (36.3 C), temperature source Oral, resp. rate 19, height '5\' 2"'$  (1.575 m), weight 55.3 kg, SpO2 97 %.  PHYSICAL EXAMINATION:  Physical Exam  GENERAL:  82 y.o.-year-old Caucasian female patient lying in the bed with no acute distress.  She was somnolent but arousable and slow to respond to questions. EYES: Pupils equal, round, reactive  to light and accommodation. No scleral icterus. Extraocular muscles intact.  HEENT: Head atraumatic, normocephalic. Oropharynx with dry mucous membrane and tongue and nasopharynx clear.  NECK:  Supple, no jugular venous distention. No thyroid enlargement, no tenderness.  LUNGS: Normal breath sounds bilaterally, no wheezing, rales,rhonchi or crepitation. No use of accessory muscles of respiration.  CARDIOVASCULAR: Regular rate and rhythm, S1, S2 normal. No murmurs, rubs, or gallops.  ABDOMEN: Soft, nondistended, nontender. Bowel sounds present. No organomegaly or mass.  EXTREMITIES: No pedal edema, cyanosis, or clubbing.  NEUROLOGIC: Cranial nerves II through XII are intact. Muscle strength 5/5 in all extremities. Sensation intact. Gait not checked.  PSYCHIATRIC: The patient is somnolent but arousable and oriented to her name and place only. Marland Kitchen SKIN: No obvious rash, lesion, or ulcer.   LABORATORY PANEL:   CBC Recent Labs  Lab 04/02/2022 1907  WBC 11.5*  HGB 10.4*  HCT 33.2*  PLT 415*   ------------------------------------------------------------------------------------------------------------------  Chemistries  Recent Labs  Lab 03/21/2022 1907 04/09/2022 1930  NA 127*  --   K 4.4  --   CL 94*  --   CO2 21*  --   GLUCOSE 163*  --   BUN 71*  --   CREATININE 2.67*  --   CALCIUM 9.9  --   MG  --  1.7  AST 54*  --   ALT 26  --   ALKPHOS 118  --   BILITOT 0.6  --    ------------------------------------------------------------------------------------------------------------------  Cardiac Enzymes No results for input(s): "TROPONINI" in the last 168 hours. ------------------------------------------------------------------------------------------------------------------  RADIOLOGY:  DG Chest Port 1 View  Result Date: 03/23/2022 CLINICAL DATA:  Possible sepsis EXAM: PORTABLE CHEST 1 VIEW COMPARISON:  CXR 02/22/19 FINDINGS: No pleural effusion. No pneumothorax. Unchanged cardiac  contours. No radiographically apparent displaced rib fractures. Compared to prior exam there is slight interval increase in prominence of bilateral interstitial opacities, which could suggest pulmonary venous congestion or atypical infection. No acute abnormality is noted in the visualized upper abdomen. Degenerative changes of the bilateral AC joints. IMPRESSION: Compared to prior exam there is slight interval increase in prominence of bilateral interstitial opacities, which could suggest pulmonary venous congestion or atypical infection. Electronically Signed   By: Marin Roberts M.D.   On: 03/18/2022 19:36      IMPRESSION AND PLAN:  Assessment and Plan: * Sepsis due to urinary tract infection ([place]) - She is really on the verge of sepsis given tachypnea and  leukocytosis though it is less than 12 however she has elevated lactic acid of 2.7 and hypotension in addition to AKI that is concerning for developing severe sepsis. - She will be admitted to a medical telemetry bed. - We will continue antibiotic therapy with IV Rocephin and given the possibility of atypical infection on chest x-ray will add Zithromax. - We will follow blood and urine cultures. - She will be hydrated with IV LR   AKI (acute kidney injury) ([place]) - This is likely. Due to volume depletion and dehydration. - Will continue hydration with IV lactated Ringer and follow BMP. - Would avoid nephrotoxins.  Hyponatremia - This is likely hypovolemic. - She will be hydrated as mentioned above and will follow BMP.  CAP (community acquired pneumonia) - This is concerning for atypical infection and therefore she will be placed on IV Rocephin and Zithromax. - It could have been contributing to her lethargy and tachypnea.   Dyslipidemia - We will continue statin therapy.  Paroxysmal atrial fibrillation (HCC) - We will continue Xarelto.  Type 2 diabetes mellitus without complications ([place]) - The patient will be placed on  supplemental coverage with NovoLog. - We will continue her basal coverage.  Essential hypertension - We will continue her antihypertensives.    DVT prophylaxis: Xarelto. Advanced Care Planning:  Code Status: full code. Family Communication:  The plan of care was discussed in details with the patient (and family). I answered all questions. The patient agreed to proceed with the above mentioned plan. Further management will depend upon hospital course. Disposition Plan: Back to previous home environment Consults called: none. All the records are reviewed and case discussed with ED provider.  Status is: Inpatient  At the time of the admission, it appears that the appropriate admission status for this patient is inpatient.  This is judged to be reasonable and necessary in order to provide the required intensity of service to ensure the patient's safety given the presenting symptoms, physical exam findings and initial radiographic and laboratory data in the context of comorbid conditions.  The patient requires inpatient status due to high intensity of service, high risk of further deterioration and high frequency of surveillance required.  I certify that at the time of admission, it is my clinical judgment that the patient will require inpatient hospital care extending more than 2 midnights.                            Dispo: The patient is from: Home              Anticipated d/c is to: Home              Patient currently is not medically stable to d/c.              Difficult to place patient: No  Christel Mormon M.D on 04/11/2022 at 12:14 AM  [facility]   From 7 PM-7 AM, contact night-coverage [email or web address]  CC: Primary care physician; Gustavo Lah, MD

## 2022-04-11 DIAGNOSIS — N390 Urinary tract infection, site not specified: Secondary | ICD-10-CM | POA: Diagnosis not present

## 2022-04-11 DIAGNOSIS — A419 Sepsis, unspecified organism: Secondary | ICD-10-CM | POA: Diagnosis not present

## 2022-04-11 DIAGNOSIS — E871 Hypo-osmolality and hyponatremia: Secondary | ICD-10-CM

## 2022-04-11 LAB — GLUCOSE, CAPILLARY
Glucose-Capillary: 102 mg/dL — ABNORMAL HIGH (ref 70–99)
Glucose-Capillary: 105 mg/dL — ABNORMAL HIGH (ref 70–99)
Glucose-Capillary: 110 mg/dL — ABNORMAL HIGH (ref 70–99)
Glucose-Capillary: 118 mg/dL — ABNORMAL HIGH (ref 70–99)
Glucose-Capillary: 132 mg/dL — ABNORMAL HIGH (ref 70–99)
Glucose-Capillary: 83 mg/dL (ref 70–99)

## 2022-04-11 LAB — CBC
HCT: 31.5 % — ABNORMAL LOW (ref 36.0–46.0)
Hemoglobin: 9.8 g/dL — ABNORMAL LOW (ref 12.0–15.0)
MCH: 26.9 pg (ref 26.0–34.0)
MCHC: 31.1 g/dL (ref 30.0–36.0)
MCV: 86.5 fL (ref 80.0–100.0)
Platelets: 318 10*3/uL (ref 150–400)
RBC: 3.64 MIL/uL — ABNORMAL LOW (ref 3.87–5.11)
RDW: 17.8 % — ABNORMAL HIGH (ref 11.5–15.5)
WBC: 10.2 10*3/uL (ref 4.0–10.5)
nRBC: 0 % (ref 0.0–0.2)

## 2022-04-11 LAB — BASIC METABOLIC PANEL
Anion gap: 10 (ref 5–15)
BUN: 58 mg/dL — ABNORMAL HIGH (ref 8–23)
CO2: 19 mmol/L — ABNORMAL LOW (ref 22–32)
Calcium: 8.7 mg/dL — ABNORMAL LOW (ref 8.9–10.3)
Chloride: 103 mmol/L (ref 98–111)
Creatinine, Ser: 1.91 mg/dL — ABNORMAL HIGH (ref 0.44–1.00)
GFR, Estimated: 26 mL/min — ABNORMAL LOW (ref >60)
Glucose, Bld: 127 mg/dL — ABNORMAL HIGH (ref 70–99)
Potassium: 3.8 mmol/L (ref 3.5–5.1)
Sodium: 132 mmol/L — ABNORMAL LOW (ref 135–145)

## 2022-04-11 LAB — BLOOD GAS, VENOUS
Acid-base deficit: 6.7 mmol/L — ABNORMAL HIGH (ref 0.0–2.0)
Acid-base deficit: 3.3 mmol/L — ABNORMAL HIGH (ref 0.0–2.0)
Bicarbonate: 19.2 mmol/L — ABNORMAL LOW (ref 20.0–28.0)
Bicarbonate: 21.3 mmol/L (ref 20.0–28.0)
O2 Saturation: 46.4 %
O2 Saturation: 76.7 %
Patient temperature: 37
Patient temperature: 37
pCO2, Ven: 39 mmHg — ABNORMAL LOW (ref 44–60)
pCO2, Ven: 36 mmHg — ABNORMAL LOW (ref 44–60)
pH, Ven: 7.3 (ref 7.25–7.43)
pH, Ven: 7.38 (ref 7.25–7.43)
pO2, Ven: 31 mmHg — CL (ref 32–45)
pO2, Ven: 48 mmHg — ABNORMAL HIGH (ref 32–45)

## 2022-04-11 LAB — PROTIME-INR
INR: 1.8 — ABNORMAL HIGH (ref 0.8–1.2)
Prothrombin Time: 20.8 seconds — ABNORMAL HIGH (ref 11.4–15.2)

## 2022-04-11 LAB — CBG MONITORING, ED
Glucose-Capillary: 102 mg/dL — ABNORMAL HIGH (ref 70–99)
Glucose-Capillary: 176 mg/dL — ABNORMAL HIGH (ref 70–99)

## 2022-04-11 LAB — CORTISOL-AM, BLOOD: Cortisol - AM: 18.7 ug/dL (ref 6.7–22.6)

## 2022-04-11 LAB — LACTIC ACID, PLASMA
Lactic Acid, Venous: 4.3 mmol/L (ref 0.5–1.9)
Lactic Acid, Venous: 1.5 mmol/L (ref 0.5–1.9)

## 2022-04-11 LAB — PROCALCITONIN: Procalcitonin: 0.27 ng/mL

## 2022-04-11 LAB — HEMOGLOBIN A1C
Hgb A1c MFr Bld: 9.6 % — ABNORMAL HIGH (ref 4.8–5.6)
Mean Plasma Glucose: 229 mg/dL

## 2022-04-11 LAB — MRSA NEXT GEN BY PCR, NASAL: MRSA by PCR Next Gen: NOT DETECTED

## 2022-04-11 MED ORDER — CHLORHEXIDINE GLUCONATE CLOTH 2 % EX PADS
6.0000 | MEDICATED_PAD | Freq: Every day | CUTANEOUS | Status: DC
  Administered 2022-04-11 – 2022-04-18 (×6): 6 via TOPICAL

## 2022-04-11 MED ORDER — SODIUM CHLORIDE 0.9 % IV BOLUS
1000.0000 mL | Freq: Once | INTRAVENOUS | Status: AC
  Administered 2022-04-11: 1000 mL via INTRAVENOUS

## 2022-04-11 MED ORDER — SODIUM CHLORIDE 0.9 % IV SOLN
250.0000 mL | INTRAVENOUS | Status: DC
  Administered 2022-04-11 – 2022-04-13 (×2): 250 mL via INTRAVENOUS

## 2022-04-11 MED ORDER — SODIUM BICARBONATE 8.4 % IV SOLN
INTRAVENOUS | Status: AC
  Filled 2022-04-11: qty 50

## 2022-04-11 MED ORDER — SODIUM BICARBONATE 8.4 % IV SOLN
50.0000 meq | Freq: Once | INTRAVENOUS | Status: AC
  Administered 2022-04-11: 50 meq via INTRAVENOUS

## 2022-04-11 MED ORDER — ORAL CARE MOUTH RINSE: 15.0000 mL | OROMUCOSAL | Status: DC | PRN

## 2022-04-11 MED ORDER — NOREPINEPHRINE 4 MG/250ML-% IV SOLN
2.0000 ug/min | INTRAVENOUS | Status: DC
  Administered 2022-04-11 – 2022-04-13 (×2): 2 ug/min via INTRAVENOUS
  Filled 2022-04-11 (×2): qty 250

## 2022-04-11 MED ORDER — MIDODRINE HCL 5 MG PO TABS
10.0000 mg | ORAL_TABLET | Freq: Three times a day (TID) | ORAL | Status: DC
  Administered 2022-04-11 – 2022-04-13 (×7): 10 mg via ORAL
  Filled 2022-04-11 (×7): qty 2

## 2022-04-11 NOTE — Progress Notes (Signed)
Consult for PIV placement for vasopressor infusion. No appropriate vein found for USGPIV for vasopressor infusion, veins to deep or small. Notified NP and Nurse. Fran Lowes, RN VAST

## 2022-04-11 NOTE — Consult Note (Signed)
NAME:  Danielle Vang, MRN:  [id number], DOB:  [birth year], LOS: 1 ADMISSION DATE:  03/20/2022, CONSULTATION DATE: 04/11/22 REFERRING MD: Dr. Sheppard Coil, CHIEF COMPLAINT: Hypotension    History of Present Illness:  This is an 82 yo female who presented to [facility] from a nursing facility on 02/25 with possible sepsis.  Per ER notes she was diagnosed with a UTI and started on antibiotics 2 days prior to ER presentation.  However, symptoms worsened pt became lethargic and tachypneic.  Outpatient labs revealed acute kidney injury with hyperkalemia and hyperglycemia prompting ER presentation.    ED Course Upon arrival to the ER lab results revealed: Na+ 127/chloride 94/CO2 21/glucose 163/BUN 71/creatinine 2.67/AST 54/lactic acid 2.7/wbc 11.5/hgb 10.4/UA positive for UTI.  CXR concerning for pulmonary venous congestion vs. atypical infection.  Pt also endorsed some irritation to her sacral area, however upon ER examination did not appear grossly infected.   Pts sbp 90's.  Pt met sepsis criteria and received iv fluid resuscitation, cefepime, vancomycin, and flagyl.  Pt admitted to the telemetry unit for additional workup and treatment per hospitalist team.  See detailed hospital course under significant events.    Pertinent  Medical History  Anxiety CAD Skin Cancer  Type II Diabetes Mellitus  HTN   Significant Hospital Events: Including procedures, antibiotic start and stop dates in addition to other pertinent events   02/25: Pt admitted to the telemetry unit with sepsis secondary to UTI and possible pneumonia  02/26: Pt transferred to the stepdown unit with hypotension despite iv fluid resuscitation requiring levophed gtt. PCCM team consulted to assist with management   Interim History / Subjective:  Pt currently hypotensive map 57.  No complaints at this time.    Objective   Blood pressure (!) 93/57, pulse (!) 53, temperature (!) 96.8 F (36 C), temperature source Rectal, resp. rate 16,  height '5\' 2"'$  (1.575 m), weight 55.3 kg, SpO2 96 %.       No intake or output data in the 24 hours ending 04/11/22 1121 Filed Weights   04/11/2022 1856  Weight: 55.3 kg    Examination: General: Acute on chronically-ill appearing frail elderly female, NAD resting in bed  HENT: Supple, no JVD  Lungs: Clear throughout, even, non labored  Cardiovascular: Irregular irregular, no r/g, 2+ radial/2+ distal pulses, no edema  Abdomen: +BS x4, soft, non tender, non distended  Extremities: Moves all extremities.  Normal tone  Neuro: Lethargic following commands, disoriented to place only, PERRLA GU: Purewick in place draining cloudy urine   Resolved Hospital Problem list     Assessment & Plan:  Septic shock secondary to UTI and possible pneumonia  Hx: CAD and HTN  - Continuous telemetry monitoring  - Aggressive iv fluid resuscitation and prn peripheral levophed gtt to maintain map >65 - Hold outpatient antihypertensives and beta-blocker therapy secondary to hypotension   Paroxysmal atrial fibrillation  - Continue outpatient xarelto   Acute kidney injury secondary to ATN in the setting of septic shock  Lactic acidosis  - Trend BMP and lactic acid - Stat VBG pending  - Replace electrolytes as indicated  - Monitor UOP - Avoid nephrotoxic medications   Sepsis secondary to UTI and possible pneumonia  - Trend WBC and monitor fever curve  - Trend PCT  - Follow cultures  - Continue azithromycin and ceftriaxone   Anemia without obvious acute blood loss  - Trend CBC  - Monitor s/sx of bleeding  - Transfuse for hgb <7  Best Practice (right  click and "Reselect all SmartList Selections" daily)   Diet/type: Regular consistency (see orders) DVT prophylaxis: Rivaroxaban  GI prophylaxis: N/A Lines: N/A Foley:  N/A Code Status:  DNR Last date of multidisciplinary goals of care discussion [N/A]  Labs   CBC: Recent Labs  Lab 03/28/2022 1907 04/11/22 0421  WBC 11.5* 10.2  NEUTROABS 9.1*   --   HGB 10.4* 9.8*  HCT 33.2* 31.5*  MCV 85.8 86.5  PLT 415* 0000000    Basic Metabolic Panel: Recent Labs  Lab 03/17/2022 1907 03/30/2022 1930 04/11/22 0421  NA 127*  --  132*  K 4.4  --  3.8  CL 94*  --  103  CO2 21*  --  19*  GLUCOSE 163*  --  127*  BUN 71*  --  58*  CREATININE 2.67*  --  1.91*  CALCIUM 9.9  --  8.7*  MG  --  1.7  --    GFR: Estimated Creatinine Clearance: 18 mL/min (A) (by C-G formula based on SCr of 1.91 mg/dL (H)). Recent Labs  Lab 04/02/2022 1907 04/11/22 0421  PROCALCITON  --  0.27  WBC 11.5* 10.2  LATICACIDVEN 2.7* 4.3*    Liver Function Tests: Recent Labs  Lab 03/30/2022 1907  AST 54*  ALT 26  ALKPHOS 118  BILITOT 0.6  PROT 6.3*  ALBUMIN 1.9*   No results for input(s): "LIPASE", "AMYLASE" in the last 168 hours. No results for input(s): "AMMONIA" in the last 168 hours.  ABG No results found for: "PHART", "PCO2ART", "PO2ART", "HCO3", "TCO2", "ACIDBASEDEF", "O2SAT"   Coagulation Profile: Recent Labs  Lab 04/13/2022 2151 04/11/22 0421  INR 1.8* 1.8*    Cardiac Enzymes: No results for input(s): "CKTOTAL", "CKMB", "CKMBINDEX", "TROPONINI" in the last 168 hours.  HbA1C: Hgb A1c MFr Bld  Date/Time Value Ref Range Status  11/09/2017 04:05 PM 8.4 (H) 4.8 - 5.6 % Final    Comment:    (NOTE) Pre diabetes:          5.7%-6.4% Diabetes:              >6.4% Glycemic control for   <7.0% adults with diabetes     CBG: Recent Labs  Lab 04/11/22 0040 04/11/22 0732 04/11/22 1006 04/11/22 1108  GLUCAP 176* 102* 102* 83    Review of Systems: Positives in BOLD   Gen: Denies fever, chills, weight change, fatigue, night sweats HEENT: Denies blurred vision, double vision, hearing loss, tinnitus, sinus congestion, rhinorrhea, sore throat, neck stiffness, dysphagia PULM: Denies shortness of breath, cough, sputum production, hemoptysis, wheezing CV: Denies chest pain, edema, orthopnea, paroxysmal nocturnal dyspnea, palpitations GI: Denies  abdominal pain, nausea, vomiting, diarrhea, hematochezia, melena, constipation, change in bowel habits GU: Denies dysuria, hematuria, polyuria, oliguria, urethral discharge Endocrine: Denies hot or cold intolerance, polyuria, polyphagia or appetite change Derm: Denies rash, dry skin, scaling or peeling skin change Heme: Denies easy bruising, bleeding, bleeding gums Neuro: headache, numbness, weakness/lethargy, slurred speech, loss of memory or consciousness   Past Medical History:  She,  has a past medical history of Anxiety, Cancer ([place]), Coronary artery disease, Diabetes mellitus without complication ([place]), and Hypertension.   Surgical History:   Past Surgical History:  Procedure Laterality Date   ABDOMINAL HYSTERECTOMY     BREAST BIOPSY Left    neg   CHOLECYSTECTOMY     CORONARY STENT PLACEMENT       Social History:   reports that she quit smoking about 19 years ago. She has never used smokeless tobacco.  She reports that she does not drink alcohol and does not use drugs.   Family History:  Her family history includes Alcohol abuse in her father; Alzheimer's disease in her mother; Migraines in her father. There is no history of Breast cancer.   Allergies No Known Allergies   Home Medications  Prior to Admission medications   Medication Sig Start Date End Date Taking? Authorizing Provider  atorvastatin (LIPITOR) 20 MG tablet Take 20 mg by mouth daily.   Yes [provider]  conjugated estrogens (PREMARIN) vaginal cream Place 1 applicator vaginally daily.   Yes [provider]  ertapenem (INVANZ) IVPB 500 mg daily. IM inj   Yes [provider]  folic acid (FOLVITE) A999333 MCG tablet Take 400 mcg by mouth daily.   Yes [provider]  insulin glargine (LANTUS) 100 UNIT/ML injection Inject 5 Units into the skin 2 (two) times daily.   Yes [provider]  insulin lispro (HUMALOG) 100 UNIT/ML injection Inject 1-8 Units into the skin in the  morning, at noon, in the evening, and at bedtime. Per sliding scale 160-200 = 1 units 201-240= 2 units 241-280=3 units 281-300= 4 units 301-350=6 units 351-400=8 units  Before meals and at bedtime   Yes [provider]  melatonin 3 MG TABS tablet Take 3 mg by mouth at bedtime.   Yes [provider]  Multiple Vitamin (MULTIVITAMIN WITH MINERALS) TABS tablet Take 1 tablet by mouth daily.   Yes [provider]  Rivaroxaban (XARELTO) 15 MG TABS tablet Take 15 mg by mouth daily with supper.   Yes [provider]  sertraline (ZOLOFT) 50 MG tablet Take 1 tablet by mouth daily. 08/30/17  Yes [provider]  ALPRAZolam Duanne Moron) 0.5 MG tablet Take 0.5 mg by mouth at bedtime as needed for anxiety. Patient not taking: Reported on 04/01/2022    [provider]  [place] peasized amount nightly for two weeks, then Monday, Wednesday and Friday thereafter Patient not taking: Reported on 04/05/2022 01/08/18   Nori Riis, PA-C  aspirin EC 325 MG tablet Take 325 mg by mouth daily. Patient not taking: Reported on 03/22/2022    [provider]  atenolol (TENORMIN) 50 MG tablet Take 50 mg by mouth daily.  Patient not taking: Reported on 04/03/2022    [provider]  cholecalciferol (VITAMIN D) 1000 units tablet Take 4,000 Units by mouth daily. Patient not taking: Reported on 04/05/2022    [provider]  glipiZIDE (GLUCOTROL) 10 MG tablet Take 10 mg by mouth daily before breakfast.  Patient not taking: Reported on 04/07/2022    [provider]  hydrochlorothiazide (MICROZIDE) 12.5 MG capsule Take 1 capsule by mouth daily. Patient not taking: Reported on 03/23/2022 08/30/17   [provider]  ibuprofen (ADVIL,MOTRIN) 400 MG tablet Take 1 tablet (400 mg total) by mouth every 6 (six) hours as needed. Patient not taking: Reported on 04/09/2022 03/06/17   Melynda Ripple, MD   meloxicam (MOBIC) 7.5 MG tablet Take 1 tablet by mouth daily. Patient not taking: Reported on 04/01/2022 09/22/17   [provider]  metFORMIN (GLUCOPHAGE) 1000 MG tablet Take 1,000 mg by mouth 2 (two) times daily with a meal. Patient not taking: Reported on 03/21/2022    [provider]  mirabegron ER (MYRBETRIQ) 50 MG TB24 tablet Take 1 tablet (50 mg total) by mouth daily. Patient not taking: Reported on 03/20/2022 12/29/17   Zara Council A, PA-C  NOVOLIN 70/30 (  70-30) 100 UNIT/ML injection Inject 14-20 Units into the skin 2 (two) times daily. 14UNITS-AM,  20UNITS-PM Patient not taking: Reported on 03/19/2022 10/31/17   [provider]  ondansetron (ZOFRAN ODT) 8 MG disintegrating tablet Take 1 tablet (8 mg total) by mouth every 8 (eight) hours as needed for nausea or vomiting. Patient not taking: Reported on 04/03/2022 08/16/19   Katy Apo, NP  simvastatin (ZOCOR) 20 MG tablet Take 20 mg by mouth daily. Patient not taking: Reported on 04/09/2022    [provider]  lisinopril (PRINIVIL,ZESTRIL) 20 MG tablet Take 20 mg by mouth daily.   08/16/19  [provider]     Critical care time: 76 minutes      Donell Beers, [place] Pager [phone] (please enter 7 digits) PCCM Consult Pager [phone] (please enter 7 digits)

## 2022-04-11 NOTE — Progress Notes (Signed)
1630-Pt is extremely drowsy, having difficulty answering orientation questions and is unable to keep eyes open. Dr Sheppard Coil given update on pt condition. 1800-No change in pt condition, Rodman Comp, NP contacted. Bicarb to be given with repeat VBG around 1930. Rodman Comp, NP to call pt's daughter to give update on pt condition.  1900-Pt more arousable, report given to Aleene Davidson, Therapist, sports. Pt's daughter at bedside and given update.

## 2022-04-11 NOTE — Progress Notes (Signed)
PROGRESS NOTE    Danielle Vang   [id number] DOB: January 06, 1941  DOA: 04/13/2022 Date of Service: 04/11/22 PCP: Gustavo Lah, MD     Brief Narrative / Hospital Course:  Danielle Vang is a 82 y.o. Caucasian female with medical history significant for type 2 diabetes mellitus, hypertension, coronary artery disease paroxysmal atrial fibrillation and anxiety, who presented to the ER with acute onset of suspected sepsis from her SNF.  The patient is a history of recurrent UTIs and was diagnosed with 1 a couple days ago and was started on antibiotics.  Despite that she became more lethargic and fatigued and was noted to have tachypnea and had labs that were concerning for elevated creatinine and potassium as well as hyperglycemia.  She admits to urinary urgency and dysuria without significant frequency.  02/25: initially RR 25 with otherwise VSS.  Later on BP was down to 98/46 with a MAP of 62, improved to 111/63 with a MAP of 76 with hydration.  [id number] with neutrophilia as well as thrombocytosis, hyponatremia and hypochloremia with a CO2 of 21, BUN of 71 and creatinine 2.67 with albumin 1.9 and ALT 54 with total protein of 6.3.  Lactic acid was 2.7. Chest x-ray showed interval increase in prominence of bilateral interstitial opacities that could suggest pulmonary venous congestion or atypical infection. Started broad-spectrum antibiotic therapy with IV cefepime, vancomycin and Flagyl.  02/26: BP improved somewhat but then down again to 88/48, another bolus ordered, BP rechecked about 500 mL in and still 90/40, Cr improved to 1.91, sodium improved to 132, lactate however worse at 4.3.   Consultants:  none  Procedures: none      ASSESSMENT & PLAN:   Principal Problem:   Sepsis due to urinary tract infection ([place]) Active Problems:   AKI (acute kidney injury) ([place])   CAP (community acquired pneumonia)   Hyponatremia   Essential hypertension   Type 2 diabetes  mellitus without complications (HCC)   Paroxysmal atrial fibrillation (HCC)   Dyslipidemia   Sepsis due to urinary tract infection ([place]), possible pneumonia  Hypotension concerning for developing septic shock  Continue IV Rocephin and given the possibility of atypical infection on chest x-ray will add Zithromax. follow blood and urine cultures. Aggressive IV fluids See below re: hypotension   Hypotension Given significant AKI and lactate, suspect hypovolemia Certainly Ddx includes developing septic shock  IV fluids Low threshold for pressors - likely will transfer to SCU for pressors if BP does not improve to at least 100s/60s  CAP (community acquired pneumonia) CXR concerning for atypical infection  IV Rocephin and Zithromax.  Hx Essential hypertension Hold home antihypertensives.  AKI (acute kidney injury) ([place]) volume depletion and dehydration Certainly Ddx includes renal hypoperfusion d/t sepsis  IV fluids  follow BMP. avoid nephrotoxins.  Type 2 diabetes mellitus without complications (HCC) Basal + SSI  Paroxysmal atrial fibrillation (HCC) continue Xarelto.  Dyslipidemia continue statin therapy.  Hyponatremia Likely hypovolemic. IV fluids follow BMP.    DVT prophylaxis: Xarelto Pertinent IV fluids/nutrition: aggressive IV fluids as above Central lines / invasive devices: none  Code Status: FULL CODE  Current Admission Status: inpatient  TOC needs / Dispo plan: likely back to SNF Barriers to discharge / significant pending items: acute illness as above - await clinical improvement, culture results. Pt is high risk decompensation.               Subjective / Brief ROS:  Patient reports feeling tired but overall better  Denies  CP/SOB.  Pain controlled.  Denies new weakness.  Reports no concerns w/ urination/defecation.   Family Communication: will call later w/ update - pt requests we call her sister Pam    Objective  Findings:  Vitals:   04/11/22 0700 04/11/22 0800 04/11/22 0839 04/11/22 0856  BP: (!) 100/44 (!) 87/45 (!) 88/48 (!) 88/48  Pulse: (!) 57 (!) 50 60   Resp: '14 16 14   '$ Temp:   97.7 F (36.5 C)   TempSrc:      SpO2: 98% 99% 96%   Weight:      Height:       No intake or output data in the 24 hours ending 04/11/22 0935 Filed Weights   03/19/2022 1856  Weight: 55.3 kg    Examination:  Physical Exam Constitutional:      General: She is not in acute distress.    Appearance: She is ill-appearing.  HENT:     Mouth/Throat:     Mouth: Mucous membranes are dry.  Eyes:     Extraocular Movements: Extraocular movements intact.  Cardiovascular:     Rate and Rhythm: Normal rate and regular rhythm.  Pulmonary:     Effort: Pulmonary effort is normal.     Breath sounds: Normal breath sounds.  Abdominal:     General: Bowel sounds are normal.     Palpations: Abdomen is soft.     Tenderness: There is no abdominal tenderness.  Musculoskeletal:     Right lower leg: No edema.     Left lower leg: No edema.  Skin:    General: Skin is dry.  Neurological:     General: No focal deficit present.     Mental Status: She is alert and oriented to person, place, and time.  Psychiatric:        Mood and Affect: Mood normal.        Behavior: Behavior normal.          Scheduled Medications:   atorvastatin  20 mg Oral Daily   folic acid  [id number] mcg Oral Daily   insulin aspart  0-15 Units Subcutaneous TID WC   insulin glargine-yfgn  5 Units Subcutaneous BID   melatonin  2.5 mg Oral QHS   multivitamin with minerals  1 tablet Oral Daily   Rivaroxaban  15 mg Oral Q supper   sertraline  50 mg Oral Daily    Continuous Infusions:  azithromycin Stopped (04/11/22 0207)   cefTRIAXone (ROCEPHIN)  IV     lactated ringers 150 mL/hr (04/11/22 0108)    PRN Medications:  acetaminophen **OR** acetaminophen, magnesium hydroxide, ondansetron **OR** ondansetron (ZOFRAN) IV, traZODone  Antimicrobials from  admission:  Anti-infectives (From admission, onward)    Start     Dose/Rate Route Frequency Ordered Stop   04/11/22 1800  cefTRIAXone (ROCEPHIN) 2 g in sodium chloride 0.9 % 100 mL IVPB        2 g 200 mL/hr over 30 Minutes Intravenous Every 24 hours 04/01/2022 2311  1759   04/11/22 0000  azithromycin (ZITHROMAX) 500 mg in sodium chloride 0.9 % 250 mL IVPB        500 mg 250 mL/hr over 60 Minutes Intravenous Every 24 hours 03/20/2022 2329     03/29/2022 1930  vancomycin (VANCOCIN) IVPB 1000 mg/200 mL premix        1,000 mg 200 mL/hr over 60 Minutes Intravenous  Once 04/02/2022 1915 03/25/2022 2308   04/09/2022 1900  ceFEPIme (MAXIPIME) 2 g in sodium chloride 0.9 %  100 mL IVPB        2 g 200 mL/hr over 30 Minutes Intravenous  Once 04/09/2022 1852 03/24/2022 2123   04/08/2022 1900  metroNIDAZOLE (FLAGYL) IVPB 500 mg        500 mg 100 mL/hr over 60 Minutes Intravenous  Once 03/22/2022 1852 03/20/2022 2233           Data Reviewed:  I have personally reviewed the following...  CBC: Recent Labs  Lab 03/27/2022 1907 04/11/22 0421  WBC 11.5* 10.2  NEUTROABS 9.1*  --   HGB 10.4* 9.8*  HCT 33.2* 31.5*  MCV 85.8 86.5  PLT 415* 0000000   Basic Metabolic Panel: Recent Labs  Lab 03/22/2022 1907 03/23/2022 1930 04/11/22 0421  NA 127*  --  132*  K 4.4  --  3.8  CL 94*  --  103  CO2 21*  --  19*  GLUCOSE 163*  --  127*  BUN 71*  --  58*  CREATININE 2.67*  --  1.91*  CALCIUM 9.9  --  8.7*  MG  --  1.7  --    GFR: Estimated Creatinine Clearance: 18 mL/min (A) (by C-G formula based on SCr of 1.91 mg/dL (H)). Liver Function Tests: Recent Labs  Lab 04/07/2022 1907  AST 54*  ALT 26  ALKPHOS 118  BILITOT 0.6  PROT 6.3*  ALBUMIN 1.9*   No results for input(s): "LIPASE", "AMYLASE" in the last 168 hours. No results for input(s): "AMMONIA" in the last 168 hours. Coagulation Profile: Recent Labs  Lab 04/12/2022 2151 04/11/22 0421  INR 1.8* 1.8*   Cardiac Enzymes: No results for input(s):  "CKTOTAL", "CKMB", "CKMBINDEX", "TROPONINI" in the last 168 hours. BNP (last 3 results) No results for input(s): "PROBNP" in the last 8760 hours. HbA1C: No results for input(s): "HGBA1C" in the last 72 hours. CBG: Recent Labs  Lab 04/11/22 0040 04/11/22 0732  GLUCAP 176* 102*   Lipid Profile: No results for input(s): "CHOL", "HDL", "LDLCALC", "TRIG", "CHOLHDL", "LDLDIRECT" in the last 72 hours. Thyroid Function Tests: No results for input(s): "TSH", "T4TOTAL", "FREET4", "T3FREE", "THYROIDAB" in the last 72 hours. Anemia Panel: No results for input(s): "VITAMINB12", "FOLATE", "FERRITIN", "TIBC", "IRON", "RETICCTPCT" in the last 72 hours. Most Recent Urinalysis On File:     Component Value Date/Time   COLORURINE YELLOW (A) 03/26/2022 2149   APPEARANCEUR TURBID (A) 04/09/2022 2149   APPEARANCEUR Cloudy (A) 11/29/2017 1113   LABSPEC 1.010 03/28/2022 2149   PHURINE 6.0 03/22/2022 2149   GLUCOSEU 50 (A) 04/08/2022 2149   HGBUR LARGE (A) 03/24/2022 2149   BILIRUBINUR NEGATIVE 03/17/2022 2149   BILIRUBINUR Negative 11/29/2017 [place] NEGATIVE 04/09/2022 2149   PROTEINUR 100 (A) 03/17/2022 2149   NITRITE NEGATIVE 03/23/2022 2149   LEUKOCYTESUR LARGE (A) 04/05/2022 2149   Sepsis Labs: '@LABRCNTIP'$ (procalcitonin:4,lacticidven:4) Microbiology: Recent Results (from the past 240 hour(s))  Resp panel by RT-PCR (RSV, Flu A&B, Covid) Anterior Nasal Swab     Status: None   Collection Time: 03/25/2022  7:07 PM   Specimen: Anterior Nasal Swab  Result Value Ref Range Status   SARS Coronavirus 2 by RT PCR NEGATIVE NEGATIVE Final    Comment: (NOTE) SARS-CoV-2 target nucleic acids are NOT DETECTED.  The SARS-CoV-2 RNA is generally detectable in upper respiratory specimens during the acute phase of infection. The lowest concentration of SARS-CoV-2 viral copies this assay can detect is 138 copies/mL. A negative result does not preclude SARS-Cov-2 infection and should not be used as the  sole basis for treatment or other patient management decisions. A negative result may occur with  improper specimen collection/handling, submission of specimen other than nasopharyngeal swab, presence of viral mutation(s) within the areas targeted by this assay, and inadequate number of viral copies(<138 copies/mL). A negative result must be combined with clinical observations, patient history, and epidemiological information. The expected result is Negative.  Fact Sheet for Patients:  EntrepreneurPulse.com.au  Fact Sheet for Healthcare Providers:  IncredibleEmployment.be  This test is no t yet approved or cleared by the [place] FDA and  has been authorized for detection and/or diagnosis of SARS-CoV-2 by FDA under an Emergency Use Authorization (EUA). This EUA will remain  in effect (meaning this test can be used) for the duration of the COVID-19 declaration under Section 564(b)(1) of the Act, 21 U.S.C.section 360bbb-3(b)(1), unless the authorization is terminated  or revoked sooner.       Influenza A by PCR NEGATIVE NEGATIVE Final   Influenza B by PCR NEGATIVE NEGATIVE Final    Comment: (NOTE) The Xpert Xpress SARS-CoV-2/FLU/RSV plus assay is intended as an aid in the diagnosis of influenza from Nasopharyngeal swab specimens and should not be used as a sole basis for treatment. Nasal washings and aspirates are unacceptable for Xpert Xpress SARS-CoV-2/FLU/RSV testing.  Fact Sheet for Patients: EntrepreneurPulse.com.au  Fact Sheet for Healthcare Providers: IncredibleEmployment.be  This test is not yet approved or cleared by the [place] FDA and has been authorized for detection and/or diagnosis of SARS-CoV-2 by FDA under an Emergency Use Authorization (EUA). This EUA will remain in effect (meaning this test can be used) for the duration of the COVID-19 declaration under Section 564(b)(1) of the  Act, 21 U.S.C. section 360bbb-3(b)(1), unless the authorization is terminated or revoked.     Resp Syncytial Virus by PCR NEGATIVE NEGATIVE Final    Comment: (NOTE) Fact Sheet for Patients: EntrepreneurPulse.com.au  Fact Sheet for Healthcare Providers: IncredibleEmployment.be  This test is not yet approved or cleared by the [place] FDA and has been authorized for detection and/or diagnosis of SARS-CoV-2 by FDA under an Emergency Use Authorization (EUA). This EUA will remain in effect (meaning this test can be used) for the duration of the COVID-19 declaration under Section 564(b)(1) of the Act, 21 U.S.C. section 360bbb-3(b)(1), unless the authorization is terminated or revoked.  Performed at [facility], [address] [place], [place] [address]   Blood Culture (routine x 2)     Status: None (Preliminary result)   Collection Time: 04/04/2022  8:20 PM   Specimen: BLOOD RIGHT ARM  Result Value Ref Range Status   Specimen Description BLOOD RIGHT ARM  Final   Special Requests   Final    BOTTLES DRAWN AEROBIC AND ANAEROBIC Blood Culture results may not be optimal due to an inadequate volume of blood received in culture bottles   Culture   Final    NO GROWTH < 12 HOURS Performed at [facility], [address] [place], [place] [address]    Report Status PENDING  Incomplete  Blood Culture (routine x 2)     Status: None (Preliminary result)   Collection Time: 03/29/2022 10:19 PM   Specimen: BLOOD RIGHT ARM  Result Value Ref Range Status   Specimen Description BLOOD RIGHT ARM  Final   Special Requests   Final    BOTTLES DRAWN AEROBIC AND ANAEROBIC Blood Culture results may not be optimal due to an excessive volume of blood received in culture bottles   Culture  Final    NO GROWTH < 12 HOURS Performed at [facility], [address] [place], [place] [address]    Report Status PENDING  Incomplete       Radiology Studies last 3 days: DG Chest Port 1 View  Result Date: 04/07/2022 CLINICAL DATA:  Possible sepsis EXAM: PORTABLE CHEST 1 VIEW COMPARISON:  CXR 02/22/19 FINDINGS: No pleural effusion. No pneumothorax. Unchanged cardiac contours. No radiographically apparent displaced rib fractures. Compared to prior exam there is slight interval increase in prominence of bilateral interstitial opacities, which could suggest pulmonary venous congestion or atypical infection. No acute abnormality is noted in the visualized upper abdomen. Degenerative changes of the bilateral AC joints. IMPRESSION: Compared to prior exam there is slight interval increase in prominence of bilateral interstitial opacities, which could suggest pulmonary venous congestion or atypical infection. Electronically Signed   By: Marin Roberts M.D.   On: 04/09/2022 19:36             LOS: 1 day    Time spent: 50 mins     Emeterio Reeve, DO [facility] 04/11/2022, 9:35 AM    Dictation software may have been used to generate the above note. Typos may occur and escape review in typed/dictated notes. Please contact Dr Sheppard Coil directly for clarity if needed.  Staff may message me via secure chat in [place]  but this may not receive an immediate response,  please page me for urgent matters!  If 7PM-7AM, please contact night coverage [email or web address]

## 2022-04-11 NOTE — Progress Notes (Signed)
Dear Doctor: This patient has been identified as a candidate for CVC (not appropriate for midline/PICC d/t renal function) for the following reason (s): drug extravasation potential with tissue necrosis (KCL, Dilantin, Dopamine, CaCl, MgSO4, chemo vesicant) If you agree, please write an order for the indicated device.  Thank you for supporting the early vascular access assessment program.

## 2022-04-11 NOTE — Assessment & Plan Note (Signed)
-   This is likely hypovolemic. - She will be hydrated as mentioned above and will follow BMP.

## 2022-04-11 NOTE — IPAL (Signed)
  Interdisciplinary Goals of Care Family Meeting   Date carried out: 04/11/2022  Location of the meeting: Bedside  Member's involved: Physician, Bedside Registered Nurse, Family Member or next of kin, and Other: patient  Durable Power of Attorney or acting medical decision maker: patient is alert and coherent, daghter is HCPOA    Discussion: We discussed goals of care for [employer] .  Given complicated illness with high risk decompensation, patient has voiced she would not want aggressive resuscitation such as CPR or intubation in the event of a cardiac or pulmonary arrest. Her daughter is in agreement and is understanding of the situation. If patient's condition is deteriorating despite other aggressive interventions (including pressors) patient would like to be made comfortable.   Code status:   Code Status: DNR   Disposition: Continue current acute care  Time spent for the meeting: 15 min    Emeterio Reeve, DO  04/11/2022, 10:16 AM

## 2022-04-11 NOTE — Hospital Course (Addendum)
Jansen Fawbush is a 82 y.o. Caucasian female with medical history significant for type 2 diabetes mellitus, hypertension, coronary artery disease paroxysmal atrial fibrillation and anxiety, who presented to the ER with acute onset of suspected sepsis from her SNF.  The patient is a history of recurrent UTIs and was diagnosed with 1 a couple days ago and was started on antibiotics.  Despite that she became more lethargic and fatigued and was noted to have tachypnea and had labs that were concerning for elevated creatinine and potassium as well as hyperglycemia.  She admits to urinary urgency and dysuria without significant frequency.  02/25: initially RR 25 with otherwise VSS.  Later on BP was down to 98/46 with a MAP of 62, improved to 111/63 with a MAP of 76 with hydration.  [id number] with neutrophilia as well as thrombocytosis, hyponatremia and hypochloremia with a CO2 of 21, BUN of 71 and creatinine 2.67 with albumin 1.9 and ALT 54 with total protein of 6.3.  Lactic acid was 2.7. Chest x-ray showed interval increase in prominence of bilateral interstitial opacities that could suggest pulmonary venous congestion or atypical infection. Started broad-spectrum antibiotic therapy with IV cefepime, vancomycin and Flagyl.  02/26: BP improved somewhat but then down again to 88/48, another bolus ordered, BP rechecked about 500 mL in and still 90/40, Cr improved to 1.91, sodium improved to 132, lactate however worse at 4.3.  02/27: worsening confusion but patient is awake and conversing with em on rounds, continuing midodrie and levophed gtt, pend TSH and cortisol  Consultants:  none  Procedures: none      ASSESSMENT & PLAN:   Principal Problem:   Sepsis due to urinary tract infection ([place]) Active Problems:   AKI (acute kidney injury) ([place])   CAP (community acquired pneumonia)   Hyponatremia   Essential hypertension   Type 2 diabetes mellitus without complications ([place])   Paroxysmal atrial  fibrillation ([place])   Dyslipidemia   Sepsis due to urinary tract infection ([place]), possible pneumonia  UTI d/t Ecoli - susceptibilities pending Hypotension concerning for developing septic shock  Blood cultures no growth thus far  Continue IV Rocephin and given the possibility of atypical infection on chest x-ray will add Zithromax. follow blood and urine cultures. Aggressive IV fluids ineffective for BP --> transferred to ICU and on pressor support  Maintain IV fluids --> improvement in renal fxn, lactic acid See below re: hypotension   Acute metabolic encephalopathy d/t sepsis Worsening confusion/lethargy since admission  Consider CT head if not improving / if worsening   Hypotension Given significant AKI and lactate, suspect hypovolemia Certainly Ddx includes developing septic shock  Aggressive IV fluids were ineffective for BP support transferred to ICU and on pressor support   Hyponatremia Likely hypovolemic Improved w/ IV fluids Monitor BMP  CAP (community acquired pneumonia) CXR concerning for atypical infection  IV Rocephin and Zithromax.  Hx Essential hypertension Hold home antihypertensives.  AKI (acute kidney injury) ([place]) - improving  D/t volume depletion, dehydration, ATN in setting of sepsis IV fluids  follow BMP. avoid nephrotoxins.  Anemia Trend CBC Transfuse Hgb <7 Monitor for any bleeding   Type 2 diabetes mellitus without complications (HCC) Basal + SSI  Paroxysmal atrial fibrillation (HCC) continue Xarelto.  Dyslipidemia continue statin therapy.  Hyponatremia Likely hypovolemic. IV fluids follow BMP.  Protein calorie malnutrition  Albumin x2 Consulted dietary    DVT prophylaxis: Xarelto Pertinent IV fluids/nutrition: IV fluids as above Central lines / invasive devices: none  Code Status: DNR  see IPAL note 02/26  Current Admission Status: inpatient  TOC needs / Dispo plan: likely back to SNF Barriers to discharge / significant  pending items: acute illness as above - await clinical improvement, culture results. Pt is high risk decompensation.

## 2022-04-11 NOTE — Plan of Care (Addendum)
Patient remains in SDU. Patient remains on infusion of Levophed for hypotension; goals SBP > 90 mmHg and MAP > 65 mmHg. PIV access only, so IV Watch is in place given vasopressor use. Patient remains hypothermic, on [employer]. No supplemental O2 at this time. Patient's mentation and LOC remain altered.   Problem: Education: Goal: Ability to describe self-care measures that may prevent or decrease complications (Diabetes Survival Skills Education) will improve Outcome: Not Progressing Goal: Individualized Educational Video(s) Outcome: Not Progressing   Problem: Coping: Goal: Ability to adjust to condition or change in health will improve Outcome: Not Progressing   Problem: Fluid Volume: Goal: Ability to maintain a balanced intake and output will improve Outcome: Not Progressing   Problem: Health Behavior/Discharge Planning: Goal: Ability to identify and utilize available resources and services will improve Outcome: Not Progressing Goal: Ability to manage health-related needs will improve Outcome: Not Progressing   Problem: Metabolic: Goal: Ability to maintain appropriate glucose levels will improve Outcome: Not Progressing   Problem: Nutritional: Goal: Maintenance of adequate nutrition will improve Outcome: Not Progressing Goal: Progress toward achieving an optimal weight will improve Outcome: Not Progressing   Problem: Skin Integrity: Goal: Risk for impaired skin integrity will decrease Outcome: Not Progressing   Problem: Tissue Perfusion: Goal: Adequacy of tissue perfusion will improve Outcome: Not Progressing   Problem: Fluid Volume: Goal: Hemodynamic stability will improve Outcome: Not Progressing   Problem: Clinical Measurements: Goal: Diagnostic test results will improve Outcome: Not Progressing Goal: Signs and symptoms of infection will decrease Outcome: Not Progressing   Problem: Respiratory: Goal: Ability to maintain adequate ventilation will  improve Outcome: Not Progressing   Problem: Education: Goal: Knowledge of General Education information will improve Description: Including pain rating scale, medication(s)/side effects and non-pharmacologic comfort measures Outcome: Not Progressing   Problem: Health Behavior/Discharge Planning: Goal: Ability to manage health-related needs will improve Outcome: Not Progressing   Problem: Clinical Measurements: Goal: Ability to maintain clinical measurements within normal limits will improve Outcome: Not Progressing Goal: Will remain free from infection Outcome: Not Progressing Goal: Diagnostic test results will improve Outcome: Not Progressing Goal: Respiratory complications will improve Outcome: Not Progressing Goal: Cardiovascular complication will be avoided Outcome: Not Progressing   Problem: Activity: Goal: Risk for activity intolerance will decrease Outcome: Not Progressing   Problem: Nutrition: Goal: Adequate nutrition will be maintained Outcome: Not Progressing   Problem: Coping: Goal: Level of anxiety will decrease Outcome: Not Progressing   Problem: Elimination: Goal: Will not experience complications related to bowel motility Outcome: Not Progressing Goal: Will not experience complications related to urinary retention Outcome: Not Progressing   Problem: Pain Managment: Goal: General experience of comfort will improve Outcome: Not Progressing   Problem: Safety: Goal: Ability to remain free from injury will improve Outcome: Not Progressing   Problem: Skin Integrity: Goal: Risk for impaired skin integrity will decrease Outcome: Not Progressing

## 2022-04-12 ENCOUNTER — Inpatient Hospital Stay: Payer: Medicare HMO

## 2022-04-12 DIAGNOSIS — E43 Unspecified severe protein-calorie malnutrition: Secondary | ICD-10-CM | POA: Insufficient documentation

## 2022-04-12 DIAGNOSIS — N390 Urinary tract infection, site not specified: Secondary | ICD-10-CM | POA: Diagnosis not present

## 2022-04-12 DIAGNOSIS — A419 Sepsis, unspecified organism: Secondary | ICD-10-CM | POA: Diagnosis not present

## 2022-04-12 LAB — CBC WITH DIFFERENTIAL/PLATELET
Abs Immature Granulocytes: 0.29 10*3/uL — ABNORMAL HIGH (ref 0.00–0.07)
Basophils Absolute: 0.1 10*3/uL (ref 0.0–0.1)
Basophils Relative: 0 %
Eosinophils Absolute: 0.1 10*3/uL (ref 0.0–0.5)
Eosinophils Relative: 1 %
HCT: 32.7 % — ABNORMAL LOW (ref 36.0–46.0)
Hemoglobin: 10 g/dL — ABNORMAL LOW (ref 12.0–15.0)
Immature Granulocytes: 3 %
Lymphocytes Relative: 8 %
Lymphs Abs: 1 10*3/uL (ref 0.7–4.0)
MCH: 26.7 pg (ref 26.0–34.0)
MCHC: 30.6 g/dL (ref 30.0–36.0)
MCV: 87.4 fL (ref 80.0–100.0)
Monocytes Absolute: 0.4 10*3/uL (ref 0.1–1.0)
Monocytes Relative: 4 %
Neutro Abs: 10 10*3/uL — ABNORMAL HIGH (ref 1.7–7.7)
Neutrophils Relative %: 84 %
Platelets: 394 10*3/uL (ref 150–400)
RBC: 3.74 MIL/uL — ABNORMAL LOW (ref 3.87–5.11)
RDW: 18.9 % — ABNORMAL HIGH (ref 11.5–15.5)
WBC: 11.8 10*3/uL — ABNORMAL HIGH (ref 4.0–10.5)
nRBC: 0 % (ref 0.0–0.2)

## 2022-04-12 LAB — GLUCOSE, CAPILLARY
Glucose-Capillary: 135 mg/dL — ABNORMAL HIGH (ref 70–99)
Glucose-Capillary: 126 mg/dL — ABNORMAL HIGH (ref 70–99)
Glucose-Capillary: 156 mg/dL — ABNORMAL HIGH (ref 70–99)
Glucose-Capillary: 234 mg/dL — ABNORMAL HIGH (ref 70–99)

## 2022-04-12 LAB — MAGNESIUM: Magnesium: 1.5 mg/dL — ABNORMAL LOW (ref 1.7–2.4)

## 2022-04-12 LAB — BASIC METABOLIC PANEL
Anion gap: 8 (ref 5–15)
BUN: 49 mg/dL — ABNORMAL HIGH (ref 8–23)
CO2: 21 mmol/L — ABNORMAL LOW (ref 22–32)
Calcium: 9 mg/dL (ref 8.9–10.3)
Chloride: 108 mmol/L (ref 98–111)
Creatinine, Ser: 1.84 mg/dL — ABNORMAL HIGH (ref 0.44–1.00)
GFR, Estimated: 27 mL/min — ABNORMAL LOW (ref >60)
Glucose, Bld: 146 mg/dL — ABNORMAL HIGH (ref 70–99)
Potassium: 3.6 mmol/L (ref 3.5–5.1)
Sodium: 137 mmol/L (ref 135–145)

## 2022-04-12 LAB — HEPATIC FUNCTION PANEL
ALT: 18 U/L (ref 0–44)
AST: 44 U/L — ABNORMAL HIGH (ref 15–41)
Albumin: 1.7 g/dL — ABNORMAL LOW (ref 3.5–5.0)
Alkaline Phosphatase: 95 U/L (ref 38–126)
Bilirubin, Direct: 0.2 mg/dL (ref 0.0–0.2)
Indirect Bilirubin: 0.4 mg/dL (ref 0.3–0.9)
Total Bilirubin: 0.6 mg/dL (ref 0.3–1.2)
Total Protein: 5.6 g/dL — ABNORMAL LOW (ref 6.5–8.1)

## 2022-04-12 LAB — PHOSPHORUS: Phosphorus: 2.9 mg/dL (ref 2.5–4.6)

## 2022-04-12 LAB — CORTISOL: Cortisol, Plasma: 19.2 ug/dL

## 2022-04-12 MED ORDER — ALBUMIN HUMAN 25 % IV SOLN
25.0000 g | Freq: Four times a day (QID) | INTRAVENOUS | Status: AC
  Administered 2022-04-12 (×2): 25 g via INTRAVENOUS
  Filled 2022-04-12 (×2): qty 100

## 2022-04-12 MED ORDER — POTASSIUM CHLORIDE CRYS ER 20 MEQ PO TBCR
20.0000 meq | EXTENDED_RELEASE_TABLET | Freq: Once | ORAL | Status: AC
  Administered 2022-04-12: 20 meq via ORAL
  Filled 2022-04-12: qty 1

## 2022-04-12 MED ORDER — LACTATED RINGERS IV BOLUS
500.0000 mL | Freq: Once | INTRAVENOUS | Status: AC
  Administered 2022-04-12: 500 mL via INTRAVENOUS

## 2022-04-12 MED ORDER — ENSURE ENLIVE PO LIQD
237.0000 mL | Freq: Three times a day (TID) | ORAL | Status: DC
  Administered 2022-04-13 – 2022-04-17 (×6): 237 mL via ORAL

## 2022-04-12 MED ORDER — MAGNESIUM SULFATE 2 GM/50ML IV SOLN
2.0000 g | Freq: Once | INTRAVENOUS | Status: AC
  Administered 2022-04-12: 2 g via INTRAVENOUS
  Filled 2022-04-12: qty 50

## 2022-04-12 NOTE — Progress Notes (Signed)
Initial Nutrition Assessment  DOCUMENTATION CODES:   Severe malnutrition in context of chronic illness  INTERVENTION:   Ensure Enlive po TID, each supplement provides 350 kcal and 20 grams of protein.  Magic cup TID with meals, each supplement provides 290 kcal and 9 grams of protein  MVI po daily   Liberalize diet   Pt at high refeed risk; recommend monitor potassium, magnesium and phosphorus labs daily until stable  Assist with meals   NUTRITION DIAGNOSIS:   Severe Malnutrition related to chronic illness (frequent admissions for UTI, CHF, advanced age) as evidenced by severe fat depletion, severe muscle depletion.  GOAL:   Patient will meet greater than or equal to 90% of their needs  MONITOR:   PO intake, Supplement acceptance, Labs, Weight trends, I & O's, Skin  REASON FOR ASSESSMENT:   Consult Assessment of nutrition requirement/status  ASSESSMENT:   82 y/o female with h/o HTN, HLD, DM, PAF, CAD, DDD, anxiety, depression, urolithiasis s/p b/l stents and ventral hernia who is admitted with UTI, sepsis and AKI.  Met with pt in room today. Pt reports that she does not feel well today but reports that she is hungry and wants to eat. RN reports patient with poor appetite and oral intake; pt eating only bites. Pt does not have the strength today to be able to suck liquid up through a straw. Pt is documented to have eaten 25% of dinner last night with total assist. Pt reports that she is willing to drink chocolate Ensure with banana. RN feels as though pt may be able to eat some ice cream. RD will add supplements and MVI to help pt meet her estimated needs. Pt is at high refeed risk. Will monitor pt's oral intake over the next couple of days. Pt may require NGT and nutrition support if her oral intake does not improve and pt wants to continue with full aggressive care.   Per chart, pt is down 19lbs(13%) since March but does appear weight stable for the past several months.    Medications reviewed and include: folic acid, melatonin, midodrine, MVI, azithromycin, ceftriaxone, levophed   Labs reviewed: K 3.6 wnl, BUN 49(H), creat 1.84(H), P 2.9 wnl, Mg 1.5(L) Wbc- 11.8(H), Hgb 10.0(L), Hct 32.7(L) Cbgs- 156, 126, 135 x 24 hrs  AIC 9.6(H)- 2/25  NUTRITION - FOCUSED PHYSICAL EXAM:  Flowsheet Row Most Recent Value  Orbital Region Severe depletion  Upper Arm Region Severe depletion  Thoracic and Lumbar Region Severe depletion  Buccal Region Moderate depletion  Temple Region Severe depletion  Clavicle Bone Region Severe depletion  Clavicle and Acromion Bone Region Severe depletion  Scapular Bone Region Severe depletion  Dorsal Hand Severe depletion  Patellar Region Severe depletion  Anterior Thigh Region Severe depletion  Posterior Calf Region Severe depletion  Edema (RD Assessment) None  Hair Reviewed  Eyes Reviewed  Mouth Reviewed  Skin Reviewed  Nails Reviewed   Diet Order:   Diet Order             Diet heart healthy/carb modified Room service appropriate? Yes; Fluid consistency: Thin  Diet effective now                  EDUCATION NEEDS:   Education needs have been addressed  Skin:  Skin Assessment: Reviewed RN Assessment (ecchymosis)  Last BM:  pta  Height:   Ht Readings from Last 1 Encounters:  03/17/2022 '5\' 2"'$  (1.575 m)    Weight:   Wt Readings from Last 1  Encounters:  04/13/2022 55.3 kg    Ideal Body Weight:  50 kg  BMI:  Body mass index is 22.31 kg/m.  Estimated Nutritional Needs:   Kcal:  1400-1600kcal/day  Protein:  70-80g/day  Fluid:  1.3-1.5L/day  Koleen Distance MS, RD, LDN Please refer to [facility] for RD and/or RD on-call/weekend/after hours pager

## 2022-04-12 NOTE — Progress Notes (Addendum)
NAME:  Danielle Vang, MRN:  [id number], DOB:  [birth year], LOS: 2 ADMISSION DATE:  03/21/2022, CONSULTATION DATE: 04/11/22 REFERRING MD: Dr. Sheppard Coil, CHIEF COMPLAINT: Hypotension    History of Present Illness:  This is an 82 yo female who presented to [facility] from a nursing facility on 02/25 with possible sepsis.  Per ER notes she was diagnosed with a UTI and started on antibiotics 2 days prior to ER presentation.  However, symptoms worsened pt became lethargic and tachypneic.  Outpatient labs revealed acute kidney injury with hyperkalemia and hyperglycemia prompting ER presentation.    ED Course Upon arrival to the ER lab results revealed: Na+ 127/chloride 94/CO2 21/glucose 163/BUN 71/creatinine 2.67/AST 54/lactic acid 2.7/wbc 11.5/hgb 10.4/UA positive for UTI.  CXR concerning for pulmonary venous congestion vs. atypical infection.  Pt also endorsed some irritation to her sacral area, however upon ER examination did not appear grossly infected.   Pts sbp 90's.  Pt met sepsis criteria and received iv fluid resuscitation, cefepime, vancomycin, and flagyl.  Pt admitted to the telemetry unit for additional workup and treatment per hospitalist team.  See detailed hospital course under significant events.    Pertinent  Medical History  Anxiety CAD Skin Cancer  Type II Diabetes Mellitus  HTN   Significant Hospital Events: Including procedures, antibiotic start and stop dates in addition to other pertinent events   02/25: Pt admitted to the telemetry unit with sepsis secondary to UTI and possible pneumonia  02/26: Pt transferred to the stepdown unit with hypotension despite iv fluid resuscitation requiring levophed gtt. PCCM team consulted to assist with management  02/26: Pt remains on levophed gtt '@2'$  mcg/min to maintain map >65.    Interim History / Subjective:  Pt with worsening confusion this morning alert to self only but able to follow commands.   Objective   Blood pressure (!)  124/43, pulse (!) 52, temperature 98.6 F (37 C), temperature source Axillary, resp. rate 18, height '5\' 2"'$  (1.575 m), weight 55.3 kg, SpO2 97 %.        Intake/Output Summary (Last 24 hours) at 04/12/2022 0753 Last data filed at 04/12/2022 0700 Gross per 24 hour  Intake 2521.9 ml  Output 400 ml  Net 2121.9 ml   Filed Weights   03/29/2022 1856  Weight: 55.3 kg    Examination: General: Acute on chronically-ill appearing frail elderly female, NAD resting in bed  HENT: Supple, no JVD  Lungs: Faint rhonchi throughout, even, non labored  Cardiovascular: Sinus bradycardia, no r/g, 2+ radial/2+ distal pulses, no edema  Abdomen: +BS x4, soft, non tender, non distended  Extremities: Moves all extremities.  Normal tone  Neuro: Awake, following commands, worsening confusion alert to self only, PERRLA GU: Purewick in place draining cloudy urine   Resolved Hospital Problem list   Lactic acidosis   Assessment & Plan:  Septic shock secondary to UTI and possible pneumonia  Hx: CAD and HTN  - Continuous telemetry monitoring  - Aggressive iv fluid resuscitation and prn peripheral levophed gtt to maintain map >60 - Continue scheduled midodrine  - Hold outpatient antihypertensives and beta-blocker therapy secondary to hypotension  - TSH with thyroid panel and cortisol level pending   Paroxysmal atrial fibrillation  - Continue outpatient xarelto   Acute kidney injury with metabolic acidosis secondary to ATN in the setting of septic shock~improving  - Trend BMP  - Replace electrolytes as indicated  - Monitor UOP - Avoid nephrotoxic medications   Sepsis secondary to UTI and possible pneumonia  -  Trend WBC and monitor fever curve  - Trend PCT  - Follow cultures  - Continue azithromycin and ceftriaxone   Anemia without obvious acute blood loss  - Trend CBC  - Monitor s/sx of bleeding  - Transfuse for hgb <7  Protein calorie malnutrition  - Albumin x2 doses - Will consult registered  dietitian   Acute metabolic encephalopathy  - If mentation does not improve throughout the day will order CT Head  - Continue supportive care   Best Practice (right click and "Reselect all SmartList Selections" daily)   Diet/type: Regular consistency (see orders) DVT prophylaxis: Rivaroxaban  GI prophylaxis: N/A Lines: N/A Foley:  N/A Code Status:  DNR Last date of multidisciplinary goals of care discussion [04/12/22]  02/27: Will update pts daughter when she arrives at bedside  Labs   CBC: Recent Labs  Lab 03/23/2022 1907 04/11/22 0421 04/12/22 0325  WBC 11.5* 10.2 11.8*  NEUTROABS 9.1*  --  10.0*  HGB 10.4* 9.8* 10.0*  HCT 33.2* 31.5* 32.7*  MCV 85.8 86.5 87.4  PLT 415* 318 [id number]    Basic Metabolic Panel: Recent Labs  Lab 03/17/2022 1907 03/19/2022 1930 04/11/22 0421 04/12/22 0325  NA 127*  --  132* 137  K 4.4  --  3.8 3.6  CL 94*  --  103 108  CO2 21*  --  19* 21*  GLUCOSE 163*  --  127* 146*  BUN 71*  --  58* 49*  CREATININE 2.67*  --  1.91* 1.84*  CALCIUM 9.9  --  8.7* 9.0  MG  --  1.7  --  1.5*  PHOS  --   --   --  2.9   GFR: Estimated Creatinine Clearance: 18.6 mL/min (A) (by C-G formula based on SCr of 1.84 mg/dL (H)). Recent Labs  Lab 03/25/2022 1907 04/11/22 0421 04/11/22 1158 04/12/22 0325  PROCALCITON  --  0.27  --   --   WBC 11.5* 10.2  --  11.8*  LATICACIDVEN 2.7* 4.3* 1.5  --     Liver Function Tests: Recent Labs  Lab 04/09/2022 1907 04/12/22 0325  AST 54* 44*  ALT 26 18  ALKPHOS 118 95  BILITOT 0.6 0.6  PROT 6.3* 5.6*  ALBUMIN 1.9* 1.7*   No results for input(s): "LIPASE", "AMYLASE" in the last 168 hours. No results for input(s): "AMMONIA" in the last 168 hours.  ABG    Component Value Date/Time   HCO3 21.3 04/11/2022 1853   ACIDBASEDEF 3.3 (H) 04/11/2022 1853   O2SAT 76.7 04/11/2022 1853     Coagulation Profile: Recent Labs  Lab 03/20/2022 2151 04/11/22 0421  INR 1.8* 1.8*    Cardiac Enzymes: No results for input(s):  "CKTOTAL", "CKMB", "CKMBINDEX", "TROPONINI" in the last 168 hours.  HbA1C: Hgb A1c MFr Bld  Date/Time Value Ref Range Status  04/08/2022 07:07 PM 9.6 (H) 4.8 - 5.6 % Final    Comment:    (NOTE)         Prediabetes: 5.7 - 6.4         Diabetes: >6.4         Glycemic control for adults with diabetes: <7.0   11/09/2017 04:05 PM 8.4 (H) 4.8 - 5.6 % Final    Comment:    (NOTE) Pre diabetes:          5.7%-6.4% Diabetes:              >6.4% Glycemic control for   <7.0% adults with diabetes  CBG: Recent Labs  Lab 04/11/22 1816 04/11/22 1952 04/11/22 2348 04/12/22 0409 04/12/22 0746  GLUCAP 105* 110* 132* 135* 126*    Review of Systems: Positives in BOLD   Gen: Denies fever, chills, weight change, fatigue, night sweats HEENT: Denies blurred vision, double vision, hearing loss, tinnitus, sinus congestion, rhinorrhea, sore throat, neck stiffness, dysphagia PULM: Denies shortness of breath, cough, sputum production, hemoptysis, wheezing CV: Denies chest pain, edema, orthopnea, paroxysmal nocturnal dyspnea, palpitations GI: Denies abdominal pain, nausea, vomiting, diarrhea, hematochezia, melena, constipation, change in bowel habits GU: Denies dysuria, hematuria, polyuria, oliguria, urethral discharge Endocrine: Denies hot or cold intolerance, polyuria, polyphagia or appetite change Derm: Denies rash, dry skin, scaling or peeling skin change Heme: Denies easy bruising, bleeding, bleeding gums Neuro: headache, numbness, confusion, weakness/lethargy, slurred speech, loss of memory or consciousness   Past Medical History:  She,  has a past medical history of Anxiety, Cancer ([place]), Coronary artery disease, Diabetes mellitus without complication ([place]), and Hypertension.   Surgical History:   Past Surgical History:  Procedure Laterality Date   ABDOMINAL HYSTERECTOMY     BREAST BIOPSY Left    neg   CHOLECYSTECTOMY     CORONARY STENT PLACEMENT       Social History:   reports  that she quit smoking about 19 years ago. She has never used smokeless tobacco. She reports that she does not drink alcohol and does not use drugs.   Family History:  Her family history includes Alcohol abuse in her father; Alzheimer's disease in her mother; Migraines in her father. There is no history of Breast cancer.   Allergies No Known Allergies   Home Medications  Prior to Admission medications   Medication Sig Start Date End Date Taking? Authorizing Provider  atorvastatin (LIPITOR) 20 MG tablet Take 20 mg by mouth daily.   Yes [provider]  conjugated estrogens (PREMARIN) vaginal cream Place 1 applicator vaginally daily.   Yes [provider]  ertapenem (INVANZ) IVPB 500 mg daily. IM inj   Yes [provider]  folic acid (FOLVITE) A999333 MCG tablet Take 400 mcg by mouth daily.   Yes [provider]  insulin glargine (LANTUS) 100 UNIT/ML injection Inject 5 Units into the skin 2 (two) times daily.   Yes [provider]  insulin lispro (HUMALOG) 100 UNIT/ML injection Inject 1-8 Units into the skin in the morning, at noon, in the evening, and at bedtime. Per sliding scale 160-200 = 1 units 201-240= 2 units 241-280=3 units 281-300= 4 units 301-350=6 units 351-400=8 units  Before meals and at bedtime   Yes [provider]  melatonin 3 MG TABS tablet Take 3 mg by mouth at bedtime.   Yes [provider]  Multiple Vitamin (MULTIVITAMIN WITH MINERALS) TABS tablet Take 1 tablet by mouth daily.   Yes [provider]  Rivaroxaban (XARELTO) 15 MG TABS tablet Take 15 mg by mouth daily with supper.   Yes [provider]  sertraline (ZOLOFT) 50 MG tablet Take 1 tablet by mouth daily. 08/30/17  Yes [provider]  ALPRAZolam Duanne Moron) 0.5 MG tablet Take 0.5 mg by mouth at bedtime as needed for anxiety. Patient not taking: Reported on 04/01/2022    [provider]  [place]olivue peasized amount nightly for two weeks, then Monday, Wednesday and Friday thereafter Patient not taking: Reported on 03/25/2022 01/08/18   Nori Riis, PA-C  aspirin EC 325 MG tablet Take 325 mg by mouth daily. Patient  not taking: Reported on 03/29/2022    [provider]  atenolol (TENORMIN) 50 MG tablet Take 50 mg by mouth daily.  Patient not taking: Reported on 04/06/2022    [provider]  cholecalciferol (VITAMIN D) 1000 units tablet Take 4,000 Units by mouth daily. Patient not taking: Reported on 04/09/2022    [provider]  glipiZIDE (GLUCOTROL) 10 MG tablet Take 10 mg by mouth daily before breakfast.  Patient not taking: Reported on 04/06/2022    [provider]  hydrochlorothiazide (MICROZIDE) 12.5 MG capsule Take 1 capsule by mouth daily. Patient not taking: Reported on 04/09/2022 08/30/17   [provider]  ibuprofen (ADVIL,MOTRIN) 400 MG tablet Take 1 tablet (400 mg total) by mouth every 6 (six) hours as needed. Patient not taking: Reported on 03/25/2022 03/06/17   Melynda Ripple, MD  meloxicam (MOBIC) 7.5 MG tablet Take 1 tablet by mouth daily. Patient not taking: Reported on 04/12/2022 09/22/17   [provider]  metFORMIN (GLUCOPHAGE) 1000 MG tablet Take 1,000 mg by mouth 2 (two) times daily with a meal. Patient not taking: Reported on 04/12/2022    [provider]  mirabegron ER (MYRBETRIQ) 50 MG TB24 tablet Take 1 tablet (50 mg total) by mouth daily. Patient not taking: Reported on 03/29/2022 12/29/17   Zara Council A, PA-C  NOVOLIN 70/30 (70-30) 100 UNIT/ML injection Inject 14-20 Units into the skin 2 (two) times daily. 14UNITS-AM,  20UNITS-PM Patient not taking: Reported on 04/13/2022 10/31/17   [provider]  ondansetron (ZOFRAN ODT) 8 MG disintegrating tablet Take 1 tablet (8 mg total) by mouth every 8 (eight) hours as needed for nausea or vomiting. Patient not taking: Reported on 04/01/2022  08/16/19   Katy Apo, NP  simvastatin (ZOCOR) 20 MG tablet Take 20 mg by mouth daily. Patient not taking: Reported on 04/01/2022    [provider]  lisinopril (PRINIVIL,ZESTRIL) 20 MG tablet Take 20 mg by mouth daily.   08/16/19  [provider]     Critical care time: 36 minutes      Donell Beers, [place] Pager [phone] (please enter 7 digits) PCCM Consult Pager [phone] (please enter 7 digits)

## 2022-04-12 NOTE — Plan of Care (Signed)
GOC consult noted. Patient is resting in bed. No family at bedside. She briefly opens eyes to voice and closes them.   Per IPAL note 2/26, DNR; comfort measures should patient decline despite other aggressive interventions.   PMT will need to reach out to family.

## 2022-04-12 NOTE — Consult Note (Signed)
PHARMACY CONSULT NOTE - ELECTROLYTES  Pharmacy Consult for Electrolyte Monitoring and Replacement   Recent Labs: Potassium (mmol/L)  Date Value  04/12/2022 3.6  10/28/2013 4.1   Magnesium (mg/dL)  Date Value  04/12/2022 1.5 (L)   Calcium (mg/dL)  Date Value  04/12/2022 9.0   Calcium, Total (mg/dL)  Date Value  10/28/2013 9.1   Albumin (g/dL)  Date Value  04/12/2022 1.7 (L)   Phosphorus (mg/dL)  Date Value  04/12/2022 2.9   Sodium (mmol/L)  Date Value  04/12/2022 137  10/28/2013 141   Corrected Ca: 10.8 mg/dL  Assessment  Danielle Vang is a 82 y.o. female presenting with septic shock 2/2 UTI and possible PNA. PMH significant for Afib, DM, HTN, CAD, recurrent UTIs. Pharmacy has been consulted to monitor and replace electrolytes.  Diet: PO MIVF: None Pertinent medications: Feeding supplement, MVI  Goal of Therapy: K >/= 4.0 and Mg >/= 2.0  Plan:  Potassium: 3.6 >> potassium chloride 20 mEq PO x 1 ordered Magnesium: 1.5 >> mag sulfate 2 g IV x 1 ordered Phosphorus: 2.9 >> no replacement needed Check BMP, Mg, Phos with AM labs  Thank you for allowing pharmacy to be a part of this patient's care.  Will M. Ouida Sills, PharmD PGY-1 Pharmacy Resident 04/12/2022 11:55 AM

## 2022-04-12 NOTE — Progress Notes (Signed)
PROGRESS NOTE    Danielle Vang   [id number] DOB: 05-29-1940  DOA: 03/21/2022 Date of Service: 04/12/22 PCP: Gustavo Lah, MD     Brief Narrative / Hospital Course:  Danielle Vang is a 82 y.o. Caucasian female with medical history significant for type 2 diabetes mellitus, hypertension, coronary artery disease paroxysmal atrial fibrillation and anxiety, who presented to the ER with acute onset of suspected sepsis from her SNF.  The patient is a history of recurrent UTIs and was diagnosed with 1 a couple days ago and was started on antibiotics.  Despite that she became more lethargic and fatigued and was noted to have tachypnea and had labs that were concerning for elevated creatinine and potassium as well as hyperglycemia.  She admits to urinary urgency and dysuria without significant frequency.  02/25: initially RR 25 with otherwise VSS.  Later on BP was down to 98/46 with a MAP of 62, improved to 111/63 with a MAP of 76 with hydration.  [id number] with neutrophilia as well as thrombocytosis, hyponatremia and hypochloremia with a CO2 of 21, BUN of 71 and creatinine 2.67 with albumin 1.9 and ALT 54 with total protein of 6.3.  Lactic acid was 2.7. Chest x-ray showed interval increase in prominence of bilateral interstitial opacities that could suggest pulmonary venous congestion or atypical infection. Started broad-spectrum antibiotic therapy with IV cefepime, vancomycin and Flagyl.  02/26: BP improved somewhat but then down again to 88/48, another bolus ordered, BP rechecked about 500 mL in and still 90/40, Cr improved to 1.91, sodium improved to 132, lactate however worse at 4.3.  02/27: worsening confusion but patient is awake and conversing with em on rounds, continuing midodrie and levophed gtt, pend TSH and cortisol  Consultants:  none  Procedures: none      ASSESSMENT & PLAN:   Principal Problem:   Sepsis due to urinary tract infection ([place]) Active  Problems:   AKI (acute kidney injury) ([place])   CAP (community acquired pneumonia)   Hyponatremia   Essential hypertension   Type 2 diabetes mellitus without complications ([place])   Paroxysmal atrial fibrillation ([place])   Dyslipidemia   Sepsis due to urinary tract infection ([place]), possible pneumonia  UTI d/t Ecoli - susceptibilities pending Hypotension concerning for developing septic shock  Blood cultures no growth thus far  Continue IV Rocephin and given the possibility of atypical infection on chest x-ray will add Zithromax. follow blood and urine cultures. Aggressive IV fluids ineffective for BP --> transferred to ICU and on pressor support  Maintain IV fluids --> improvement in renal fxn, lactic acid See below re: hypotension   Acute metabolic encephalopathy d/t sepsis Worsening confusion/lethargy since admission  Consider CT head if not improving / if worsening   Hypotension Given significant AKI and lactate, suspect hypovolemia Certainly Ddx includes developing septic shock  Aggressive IV fluids were ineffective for BP support transferred to ICU and on pressor support   Hyponatremia Likely hypovolemic Improved w/ IV fluids Monitor BMP  CAP (community acquired pneumonia) CXR concerning for atypical infection  IV Rocephin and Zithromax.  Hx Essential hypertension Hold home antihypertensives.  AKI (acute kidney injury) ([place]) - improving  D/t volume depletion, dehydration, ATN in setting of sepsis IV fluids  follow BMP. avoid nephrotoxins.  Anemia Trend CBC Transfuse Hgb <7 Monitor for any bleeding   Type 2 diabetes mellitus without complications (HCC) Basal + SSI  Paroxysmal atrial fibrillation (HCC) continue Xarelto.  Dyslipidemia continue statin therapy.  Hyponatremia Likely hypovolemic. IV  fluids follow BMP.  Protein calorie malnutrition  Albumin x2 Consulted dietary    DVT prophylaxis: Xarelto Pertinent IV fluids/nutrition: IV fluids as  above Central lines / invasive devices: none  Code Status: DNR see IPAL note 02/26  Current Admission Status: inpatient  TOC needs / Dispo plan: likely back to SNF Barriers to discharge / significant pending items: acute illness as above - await clinical improvement, culture results. Pt is high risk decompensation.               Subjective / Brief ROS:  Patient reports feeling tired  Denies CP/SOB.    Family Communication: will call later today w/ update and addend this note if able to reach family.     Objective Findings:  Vitals:   04/12/22 1145 04/12/22 1200 04/12/22 1300 04/12/22 1400  BP:  (!) 112/46 (!) 111/54 (!) 115/43  Pulse: (!) 48 (!) 48 (!) 51 (!) 49  Resp: '12 12 18 12  '$ Temp:      TempSrc:      SpO2: 100% 99% 99% 97%  Weight:      Height:        Intake/Output Summary (Last 24 hours) at 04/12/2022 1418 Last data filed at 04/12/2022 1000 Gross per 24 hour  Intake 768.04 ml  Output 650 ml  Net 118.04 ml   Filed Weights   03/21/2022 1856  Weight: 55.3 kg    Examination:  Physical Exam Constitutional:      General: She is not in acute distress.    Appearance: She is ill-appearing.  HENT:     Mouth/Throat:     Mouth: Mucous membranes are dry.  Eyes:     Extraocular Movements: Extraocular movements intact.  Cardiovascular:     Rate and Rhythm: Normal rate and regular rhythm.  Pulmonary:     Effort: Pulmonary effort is normal.     Breath sounds: Normal breath sounds.  Abdominal:     General: Bowel sounds are normal.     Palpations: Abdomen is soft.     Tenderness: There is no abdominal tenderness.  Musculoskeletal:     Right lower leg: No edema.     Left lower leg: No edema.  Skin:    General: Skin is dry.  Neurological:     General: No focal deficit present.     Mental Status: She is alert and oriented to person, place, and time.  Psychiatric:        Mood and Affect: Mood normal.        Behavior: Behavior normal.           Scheduled Medications:   atorvastatin  20 mg Oral Daily   Chlorhexidine Gluconate Cloth  6 each Topical Daily   [START ON 04/13/2022] feeding supplement  237 mL Oral TID BM   folic acid  [id number] mcg Oral Daily   melatonin  2.5 mg Oral QHS   midodrine  10 mg Oral TID WC   multivitamin with minerals  1 tablet Oral Daily   potassium chloride  20 mEq Oral Once   Rivaroxaban  15 mg Oral Q supper   sertraline  50 mg Oral Daily    Continuous Infusions:  sodium chloride 10 mL/hr at 04/12/22 1000   azithromycin Stopped (04/12/22 0137)   cefTRIAXone (ROCEPHIN)  IV Stopped (04/11/22 1929)   norepinephrine (LEVOPHED) Adult infusion 2 mcg/min (04/12/22 1000)    PRN Medications:  acetaminophen **OR** acetaminophen, magnesium hydroxide, ondansetron **OR** ondansetron (ZOFRAN) IV, mouth rinse, traZODone  Antimicrobials from admission:  Anti-infectives (From admission, onward)    Start     Dose/Rate Route Frequency Ordered Stop   04/11/22 1800  cefTRIAXone (ROCEPHIN) 2 g in sodium chloride 0.9 % 100 mL IVPB        2 g 200 mL/hr over 30 Minutes Intravenous Every 24 hours 04/07/2022 2311  1759   04/11/22 0000  azithromycin (ZITHROMAX) 500 mg in sodium chloride 0.9 % 250 mL IVPB        500 mg 250 mL/hr over 60 Minutes Intravenous Every 24 hours 04/07/2022 2329     03/25/2022 1930  vancomycin (VANCOCIN) IVPB 1000 mg/200 mL premix        1,000 mg 200 mL/hr over 60 Minutes Intravenous  Once 03/31/2022 1915 04/07/2022 2308   04/11/2022 1900  ceFEPIme (MAXIPIME) 2 g in sodium chloride 0.9 % 100 mL IVPB        2 g 200 mL/hr over 30 Minutes Intravenous  Once 04/04/2022 1852 03/21/2022 2123   04/07/2022 1900  metroNIDAZOLE (FLAGYL) IVPB 500 mg        500 mg 100 mL/hr over 60 Minutes Intravenous  Once 03/27/2022 1852 03/17/2022 2233           Data Reviewed:  I have personally reviewed the following...  CBC: Recent Labs  Lab 03/28/2022 1907 04/11/22 0421 04/12/22 0325  WBC 11.5* 10.2 11.8*   NEUTROABS 9.1*  --  10.0*  HGB 10.4* 9.8* 10.0*  HCT 33.2* 31.5* 32.7*  MCV 85.8 86.5 87.4  PLT 415* 318 [id number]   Basic Metabolic Panel: Recent Labs  Lab 03/29/2022 1907 03/27/2022 1930 04/11/22 0421 04/12/22 0325  NA 127*  --  132* 137  K 4.4  --  3.8 3.6  CL 94*  --  103 108  CO2 21*  --  19* 21*  GLUCOSE 163*  --  127* 146*  BUN 71*  --  58* 49*  CREATININE 2.67*  --  1.91* 1.84*  CALCIUM 9.9  --  8.7* 9.0  MG  --  1.7  --  1.5*  PHOS  --   --   --  2.9   GFR: Estimated Creatinine Clearance: 18.6 mL/min (A) (by C-G formula based on SCr of 1.84 mg/dL (H)). Liver Function Tests: Recent Labs  Lab 04/12/2022 1907 04/12/22 0325  AST 54* 44*  ALT 26 18  ALKPHOS 118 95  BILITOT 0.6 0.6  PROT 6.3* 5.6*  ALBUMIN 1.9* 1.7*   No results for input(s): "LIPASE", "AMYLASE" in the last 168 hours. No results for input(s): "AMMONIA" in the last 168 hours. Coagulation Profile: Recent Labs  Lab 03/21/2022 2151 04/11/22 0421  INR 1.8* 1.8*   Cardiac Enzymes: No results for input(s): "CKTOTAL", "CKMB", "CKMBINDEX", "TROPONINI" in the last 168 hours. BNP (last 3 results) No results for input(s): "PROBNP" in the last 8760 hours. HbA1C: Recent Labs    04/13/2022 1907  HGBA1C 9.6*   CBG: Recent Labs  Lab 04/11/22 1952 04/11/22 2348 04/12/22 0409 04/12/22 0746 04/12/22 1136  GLUCAP 110* 132* 135* 126* 156*   Lipid Profile: No results for input(s): "CHOL", "HDL", "LDLCALC", "TRIG", "CHOLHDL", "LDLDIRECT" in the last 72 hours. Thyroid Function Tests: No results for input(s): "TSH", "T4TOTAL", "FREET4", "T3FREE", "THYROIDAB" in the last 72 hours. Anemia Panel: No results for input(s): "VITAMINB12", "FOLATE", "FERRITIN", "TIBC", "IRON", "RETICCTPCT" in the last 72 hours. Most Recent Urinalysis On File:     Component Value Date/Time   COLORURINE YELLOW (A) 04/05/2022 2149   APPEARANCEUR  TURBID (A) 03/20/2022 2149   APPEARANCEUR Cloudy (A) 11/29/2017 1113   LABSPEC 1.010  03/17/2022 2149   PHURINE 6.0 03/24/2022 2149   GLUCOSEU 50 (A) 03/23/2022 2149   HGBUR LARGE (A) 04/01/2022 2149   BILIRUBINUR NEGATIVE 04/09/2022 2149   BILIRUBINUR Negative 11/29/2017 [place] 04/01/2022 2149   PROTEINUR 100 (A) 03/25/2022 2149   NITRITE NEGATIVE 03/30/2022 2149   LEUKOCYTESUR LARGE (A) 04/01/2022 2149   Sepsis Labs: '@LABRCNTIP'$ (procalcitonin:4,lacticidven:4) Microbiology: Recent Results (from the past 240 hour(s))  Resp panel by RT-PCR (RSV, Flu A&B, Covid) Anterior Nasal Swab     Status: None   Collection Time: 03/24/2022  7:07 PM   Specimen: Anterior Nasal Swab  Result Value Ref Range Status   SARS Coronavirus 2 by RT PCR NEGATIVE NEGATIVE Final    Comment: (NOTE) SARS-CoV-2 target nucleic acids are NOT DETECTED.  The SARS-CoV-2 RNA is generally detectable in upper respiratory specimens during the acute phase of infection. The lowest concentration of SARS-CoV-2 viral copies this assay can detect is 138 copies/mL. A negative result does not preclude SARS-Cov-2 infection and should not be used as the sole basis for treatment or other patient management decisions. A negative result may occur with  improper specimen collection/handling, submission of specimen other than nasopharyngeal swab, presence of viral mutation(s) within the areas targeted by this assay, and inadequate number of viral copies(<138 copies/mL). A negative result must be combined with clinical observations, patient history, and epidemiological information. The expected result is Negative.  Fact Sheet for Patients:  EntrepreneurPulse.com.au  Fact Sheet for Healthcare Providers:  IncredibleEmployment.be  This test is no t yet approved or cleared by the [place] FDA and  has been authorized for detection and/or diagnosis of SARS-CoV-2 by FDA under an Emergency Use Authorization (EUA). This EUA will remain  in effect (meaning this test  can be used) for the duration of the COVID-19 declaration under Section 564(b)(1) of the Act, 21 U.S.C.section 360bbb-3(b)(1), unless the authorization is terminated  or revoked sooner.       Influenza A by PCR NEGATIVE NEGATIVE Final   Influenza B by PCR NEGATIVE NEGATIVE Final    Comment: (NOTE) The Xpert Xpress SARS-CoV-2/FLU/RSV plus assay is intended as an aid in the diagnosis of influenza from Nasopharyngeal swab specimens and should not be used as a sole basis for treatment. Nasal washings and aspirates are unacceptable for Xpert Xpress SARS-CoV-2/FLU/RSV testing.  Fact Sheet for Patients: EntrepreneurPulse.com.au  Fact Sheet for Healthcare Providers: IncredibleEmployment.be  This test is not yet approved or cleared by the [place] FDA and has been authorized for detection and/or diagnosis of SARS-CoV-2 by FDA under an Emergency Use Authorization (EUA). This EUA will remain in effect (meaning this test can be used) for the duration of the COVID-19 declaration under Section 564(b)(1) of the Act, 21 U.S.C. section 360bbb-3(b)(1), unless the authorization is terminated or revoked.     Resp Syncytial Virus by PCR NEGATIVE NEGATIVE Final    Comment: (NOTE) Fact Sheet for Patients: EntrepreneurPulse.com.au  Fact Sheet for Healthcare Providers: IncredibleEmployment.be  This test is not yet approved or cleared by the [place] FDA and has been authorized for detection and/or diagnosis of SARS-CoV-2 by FDA under an Emergency Use Authorization (EUA). This EUA will remain in effect (meaning this test can be used) for the duration of the COVID-19 declaration under Section 564(b)(1) of the Act, 21 U.S.C. section 360bbb-3(b)(1), unless the authorization is terminated or revoked.  Performed at [facility],  [place], [place] [address]   Blood Culture (routine x 2)      Status: None (Preliminary result)   Collection Time: 04/11/2022  8:20 PM   Specimen: BLOOD RIGHT ARM  Result Value Ref Range Status   Specimen Description BLOOD RIGHT ARM  Final   Special Requests   Final    BOTTLES DRAWN AEROBIC AND ANAEROBIC Blood Culture results may not be optimal due to an inadequate volume of blood received in culture bottles   Culture   Final    NO GROWTH 2 DAYS Performed at [facility], [address], [place], [place] [address]    Report Status PENDING  Incomplete  Urine Culture     Status: Abnormal (Preliminary result)   Collection Time: 03/20/2022  9:49 PM   Specimen: Urine, Random  Result Value Ref Range Status   Specimen Description   Final    URINE, RANDOM Performed at [facility], [address] [place], [place] [address]    Special Requests   Final    NONE Reflexed from [id number] Performed at [facility], [address] [place], [place] [address]    Culture (A)  Final    20,000 COLONIES/mL ESCHERICHIA COLI SUSCEPTIBILITIES TO FOLLOW Performed at [facility], [place] [address], [place], [place] [address]    Report Status PENDING  Incomplete  Blood Culture (routine x 2)     Status: None (Preliminary result)   Collection Time: 04/02/2022 10:19 PM   Specimen: BLOOD RIGHT ARM  Result Value Ref Range Status   Specimen Description BLOOD RIGHT ARM  Final   Special Requests   Final    BOTTLES DRAWN AEROBIC AND ANAEROBIC Blood Culture results may not be optimal due to an excessive volume of blood received in culture bottles   Culture   Final    NO GROWTH 2 DAYS Performed at [facility], [address] [place], [place] [address]    Report Status PENDING  Incomplete  MRSA Next Gen by PCR, Nasal     Status: None   Collection Time: 04/11/22 11:14 AM   Specimen: Nasal Mucosa; Nasal Swab  Result Value Ref Range Status   MRSA by PCR Next Gen NOT DETECTED NOT DETECTED Final    Comment: (NOTE) The GeneXpert MRSA  Assay (FDA approved for NASAL specimens only), is one component of a comprehensive MRSA colonization surveillance program. It is not intended to diagnose MRSA infection nor to guide or monitor treatment for MRSA infections. Test performance is not FDA approved in patients less than 8 years old. Performed at [facility], [address] [place],  [address]       Radiology Studies last 3 days: [facility] Chest [facility] 1 View  Result Date: 04/12/2022 CLINICAL DATA:  Pneumonia EXAM: PORTABLE CHEST 1 VIEW COMPARISON:  04/04/2022 FINDINGS: Normal mediastinum and cardiac silhouette. Chronic central bronchitic markings. Streaky opacity in the RIGHT lower lobe is slightly more dense. Normal pulmonary vasculature. No effusion, infiltrate, or pneumothorax. IMPRESSION: 1. Streaky opacity in the RIGHT lower lobe could represent atelectasis or pneumonia. 2. Chronic bronchitic markings. Electronically Signed   By: Suzy Bouchard M.D.   On: 04/12/2022 08:21   DG Chest Port 1 View  Result Date: 03/20/2022 CLINICAL DATA:  Possible sepsis EXAM: PORTABLE CHEST 1 VIEW COMPARISON:  CXR 02/22/19 FINDINGS: No pleural effusion. No pneumothorax. Unchanged cardiac contours. No radiographically apparent displaced rib fractures. Compared to prior exam there is slight interval increase  in prominence of bilateral interstitial opacities, which could suggest pulmonary venous congestion or atypical infection. No acute abnormality is noted in the visualized upper abdomen. Degenerative changes of the bilateral AC joints. IMPRESSION: Compared to prior exam there is slight interval increase in prominence of bilateral interstitial opacities, which could suggest pulmonary venous congestion or atypical infection. Electronically Signed   By: Marin Roberts M.D.   On: 04/04/2022 19:36             LOS: 2 days    Time spent: 50 mins     Emeterio Reeve, DO [facility] 04/12/2022, 2:18 PM    Dictation  software may have been used to generate the above note. Typos may occur and escape review in typed/dictated notes. Please contact Dr Sheppard Coil directly for clarity if needed.  Staff may message me via secure chat in [place]  but this may not receive an immediate response,  please page me for urgent matters!  If 7PM-7AM, please contact night coverage [email or web address]

## 2022-04-13 DIAGNOSIS — A419 Sepsis, unspecified organism: Secondary | ICD-10-CM | POA: Diagnosis not present

## 2022-04-13 DIAGNOSIS — Z7189 Other specified counseling: Secondary | ICD-10-CM

## 2022-04-13 DIAGNOSIS — N390 Urinary tract infection, site not specified: Secondary | ICD-10-CM | POA: Diagnosis not present

## 2022-04-13 LAB — CBC WITH DIFFERENTIAL/PLATELET
Abs Immature Granulocytes: 0.2 10*3/uL — ABNORMAL HIGH (ref 0.00–0.07)
Basophils Absolute: 0 10*3/uL (ref 0.0–0.1)
Basophils Relative: 0 %
Eosinophils Absolute: 0.1 10*3/uL (ref 0.0–0.5)
Eosinophils Relative: 1 %
HCT: 30.1 % — ABNORMAL LOW (ref 36.0–46.0)
Hemoglobin: 9.1 g/dL — ABNORMAL LOW (ref 12.0–15.0)
Immature Granulocytes: 2 %
Lymphocytes Relative: 11 %
Lymphs Abs: 1.2 10*3/uL (ref 0.7–4.0)
MCH: 26.8 pg (ref 26.0–34.0)
MCHC: 30.2 g/dL (ref 30.0–36.0)
MCV: 88.8 fL (ref 80.0–100.0)
Monocytes Absolute: 0.5 10*3/uL (ref 0.1–1.0)
Monocytes Relative: 4 %
Neutro Abs: 8.9 10*3/uL — ABNORMAL HIGH (ref 1.7–7.7)
Neutrophils Relative %: 82 %
Platelets: 287 10*3/uL (ref 150–400)
RBC: 3.39 MIL/uL — ABNORMAL LOW (ref 3.87–5.11)
RDW: 19.2 % — ABNORMAL HIGH (ref 11.5–15.5)
WBC: 10.9 10*3/uL — ABNORMAL HIGH (ref 4.0–10.5)
nRBC: 0 % (ref 0.0–0.2)

## 2022-04-13 LAB — BASIC METABOLIC PANEL
Anion gap: 9 (ref 5–15)
BUN: 44 mg/dL — ABNORMAL HIGH (ref 8–23)
CO2: 21 mmol/L — ABNORMAL LOW (ref 22–32)
Calcium: 9.6 mg/dL (ref 8.9–10.3)
Chloride: 110 mmol/L (ref 98–111)
Creatinine, Ser: 1.92 mg/dL — ABNORMAL HIGH (ref 0.44–1.00)
GFR, Estimated: 26 mL/min — ABNORMAL LOW (ref >60)
Glucose, Bld: 190 mg/dL — ABNORMAL HIGH (ref 70–99)
Potassium: 3.9 mmol/L (ref 3.5–5.1)
Sodium: 140 mmol/L (ref 135–145)

## 2022-04-13 LAB — PHOSPHORUS: Phosphorus: 2.5 mg/dL (ref 2.5–4.6)

## 2022-04-13 LAB — GLUCOSE, CAPILLARY
Glucose-Capillary: 154 mg/dL — ABNORMAL HIGH (ref 70–99)
Glucose-Capillary: 166 mg/dL — ABNORMAL HIGH (ref 70–99)

## 2022-04-13 LAB — MAGNESIUM: Magnesium: 2.1 mg/dL (ref 1.7–2.4)

## 2022-04-13 MED ORDER — POLYVINYL ALCOHOL 1.4 % OP SOLN: 1.0000 [drp] | Freq: Four times a day (QID) | OPHTHALMIC | Status: DC | PRN

## 2022-04-13 MED ORDER — GLYCOPYRROLATE 1 MG PO TABS: 1.0000 mg | ORAL_TABLET | ORAL | Status: DC | PRN

## 2022-04-13 MED ORDER — LORAZEPAM 2 MG/ML IJ SOLN: 1.0000 mg | INTRAMUSCULAR | Status: DC | PRN

## 2022-04-13 MED ORDER — LORAZEPAM 1 MG PO TABS: 1.0000 mg | ORAL_TABLET | ORAL | Status: DC | PRN

## 2022-04-13 MED ORDER — ONDANSETRON HCL 4 MG/2ML IJ SOLN: 4.0000 mg | Freq: Four times a day (QID) | INTRAMUSCULAR | Status: DC | PRN

## 2022-04-13 MED ORDER — HALOPERIDOL LACTATE 5 MG/ML IJ SOLN: 0.5000 mg | INTRAMUSCULAR | Status: DC | PRN

## 2022-04-13 MED ORDER — ACETAMINOPHEN 325 MG PO TABS
650.0000 mg | ORAL_TABLET | Freq: Four times a day (QID) | ORAL | Status: DC | PRN
  Filled 2022-04-13: qty 2

## 2022-04-13 MED ORDER — HALOPERIDOL 0.5 MG PO TABS: 0.5000 mg | ORAL_TABLET | ORAL | Status: DC | PRN

## 2022-04-13 MED ORDER — BIOTENE DRY MOUTH MT LIQD: 15.0000 mL | OROMUCOSAL | Status: DC | PRN

## 2022-04-13 MED ORDER — MORPHINE SULFATE (PF) 2 MG/ML IV SOLN
2.0000 mg | INTRAVENOUS | Status: DC | PRN
  Administered 2022-04-14: 2 mg via INTRAVENOUS
  Filled 2022-04-13: qty 1

## 2022-04-13 MED ORDER — K PHOS MONO-SOD PHOS DI & MONO 155-852-130 MG PO TABS
500.0000 mg | ORAL_TABLET | ORAL | Status: DC
  Administered 2022-04-13: 500 mg via ORAL
  Filled 2022-04-13 (×2): qty 2

## 2022-04-13 MED ORDER — GLYCOPYRROLATE 0.2 MG/ML IJ SOLN: 0.2000 mg | INTRAMUSCULAR | Status: DC | PRN

## 2022-04-13 MED ORDER — LORAZEPAM 2 MG/ML PO CONC: 1.0000 mg | ORAL | Status: DC | PRN

## 2022-04-13 MED ORDER — POTASSIUM CHLORIDE CRYS ER 20 MEQ PO TBCR
20.0000 meq | EXTENDED_RELEASE_TABLET | Freq: Once | ORAL | Status: AC
  Administered 2022-04-13: 20 meq via ORAL
  Filled 2022-04-13: qty 1

## 2022-04-13 MED ORDER — HALOPERIDOL LACTATE 2 MG/ML PO CONC: 0.5000 mg | ORAL | Status: DC | PRN

## 2022-04-13 MED ORDER — ACETAMINOPHEN 650 MG RE SUPP: 650.0000 mg | Freq: Four times a day (QID) | RECTAL | Status: DC | PRN

## 2022-04-13 MED ORDER — ONDANSETRON 4 MG PO TBDP: 4.0000 mg | ORAL_TABLET | Freq: Four times a day (QID) | ORAL | Status: DC | PRN

## 2022-04-13 NOTE — Progress Notes (Signed)
[place] ICU 4 [facility]) Hospice hospital liaison note  Received request from TOC/PMT for family interest in hospice home.   Chart reviewed and hospice home eligibility is Pending at this time.   Unfortunately hospice home does not have a bed to offer today. TOC is aware hospital liaison will follow up tomorrow or sooner if room becomes available.   Please do not hesitate to call with any hospice related questions or concerns.   Thank you for the opportunity to participate in this patient's care.  Jhonnie Garner, Therapist, sports, [facility] Liaison  [phone]

## 2022-04-13 NOTE — Consult Note (Signed)
PHARMACY CONSULT NOTE - ELECTROLYTES  Pharmacy Consult for Electrolyte Monitoring and Replacement   Recent Labs: Potassium (mmol/L)  Date Value  04/13/2022 3.9  10/28/2013 4.1   Magnesium (mg/dL)  Date Value  04/13/2022 2.1   Calcium (mg/dL)  Date Value  04/13/2022 9.6   Calcium, Total (mg/dL)  Date Value  10/28/2013 9.1   Albumin (g/dL)  Date Value  04/12/2022 1.7 (L)   Phosphorus (mg/dL)  Date Value  04/13/2022 2.5   Sodium (mmol/L)  Date Value  04/13/2022 140  10/28/2013 141   Assessment  Danielle Vang is a 82 y.o. female presenting with septic shock 2/2 UTI and possible PNA. PMH significant for Afib, DM, HTN, CAD, recurrent UTIs. Pharmacy has been consulted to monitor and replace electrolytes.  Diet: PO MIVF: None Pertinent medications: Feeding supplement, MVI  Goal of Therapy: K >/= 4.0 and Mg >/= 2.0  Plan:  Potassium: 3.9 >> potassium chloride 20 mEq PO x 1 ordered Magnesium: 2.1 >> no replacement needed Phosphorus: 2.5 >> K-Phos Neutral tablets 500 mg x 2 ordered Check BMP, Mg, Phos with AM labs  Thank you for allowing pharmacy to be a part of this patient's care.  Will M. Ouida Sills, PharmD PGY-1 Pharmacy Resident 04/13/2022 6:51 AM

## 2022-04-13 NOTE — Progress Notes (Signed)
PROGRESS NOTE    Danielle Vang  [id number] DOB: [birth year] DOA: 04/06/2022 PCP: Danielle Lah, MD  [id number]  LOS: 3 days   Brief hospital course:   Assessment & Plan: Danielle Vang is a 82 y.o. Caucasian female with medical history significant for type 2 diabetes mellitus, hypertension, coronary artery disease paroxysmal atrial fibrillation and anxiety, who presented to the ER with acute onset of suspected sepsis from her SNF.  The patient is a history of recurrent UTIs and was diagnosed with 1 a couple days ago and was started on antibiotics.  Despite that she became more lethargic and fatigued and was noted to have tachypnea and had labs that were concerning for elevated creatinine and potassium as well as hyperglycemia.  She admits to urinary urgency and dysuria without significant frequency.    Acute metabolic encephalopathy  Failure to thrive --was lethargic and fatigued at SNF.  AMS more likely due to severe dehydration.  Pt also presented with signs of severe malnutrition, so likely has had poor oral intake for a while PTA. --made comfort care today  AKI (acute kidney injury) ([place]) - improving  Cr 2.67, BUN 71 on presentation.  Cr 1.69 on 03/31/22. D/t severe dehydration, volume depletion. --d/c MIVF since pt is comfort care now  Sepsis, ruled out  Possible UTI --urine cx only 20,000 colonies.  Completed 3 days of cefepime/ceftriaxone  Hypotension Given significant AKI and lactate, suspect hypovolemia --s/p IVF, received pressor support in ICU, started on midodrine. --weaned off pressor today --d/c midodrine since pt is comfort care status now  Hyponatremia, resolved Likely hypovolemic Improved w/ IV fluids   CAP, ruled out --evidence of PNA not convincing --d/c abx for CAP   Hx Essential hypertension --BP medication held since hypotensive   Anemia   Type 2 diabetes mellitus, poorly controlled --A1c 9.6. --d/c BG checks now pt is  comfort care   Paroxysmal atrial fibrillation (HCC) --d/c Xarelto now pt is comfort care   Dyslipidemia --d/c statin therapy now pt is comfort care   Protein calorie malnutrition, severe   DVT prophylaxis: None:Comfort Care Code Status: DNR  Family Communication:  Level of care: Med-Surg Dispo:   The patient is from: SNF rehab Anticipated d/c is to: hospice facility Anticipated d/c date is: whenever bed available   Subjective and Interval History:  RN reported pt had poor oral intake and lethargic.  Pt denied pain.  After discussion between palliative care provider and daughter, decision was made for comfort care and hospice facility discharge.   Objective: Vitals:   04/13/22 1400 04/13/22 1430 04/13/22 1600 04/13/22 1744  BP: (!) 120/41 (!) 122/45 (!) 114/46 (!) 116/48  Pulse: (!) 49 (!) 57 (!) 49 (!) 105  Resp: '16 16 14 20  '$ Temp:    98 F (36.7 C)  TempSrc:      SpO2: 100% 100% 97% 95%  Weight:      Height:        Intake/Output Summary (Last 24 hours) at 04/13/2022 1812 Last data filed at 04/13/2022 1300 Gross per 24 hour  Intake 715.48 ml  Output 250 ml  Net 465.48 ml   Filed Weights   03/24/2022 1856 04/13/22 0800  Weight: 55.3 kg 49.6 kg    Examination:   Constitutional: NAD, lethargic, not oriented HEENT: conjunctivae and lids normal, EOMI CV: No cyanosis.   RESP: normal respiratory effort, on RA SKIN: warm, dry   Data Reviewed: I have personally reviewed labs and imaging studies  Time spent: 50 minutes  Danielle Bi, MD [facility] If 7PM-7AM, please contact night-coverage 04/13/2022, 6:12 PM

## 2022-04-13 NOTE — Consult Note (Signed)
Consultation Note Date: 04/13/2022   Patient Name: Danielle Vang  DOB: 1940-12-07  MRN: [id number]  Age / Sex: 82 y.o., female  PCP: Gustavo Lah, MD Referring Physician: Enzo Bi, MD  Reason for Consultation: Establishing goals of care  HPI/Patient Profile: Danielle Vang is a 82 y.o. Caucasian female with medical history significant for type 2 diabetes mellitus, hypertension, coronary artery disease paroxysmal atrial fibrillation and anxiety, who presented to the ER with acute onset of suspected sepsis from her SNF.  The patient is a history of recurrent UTIs and was diagnosed with 1 a couple days ago and was started on antibiotics.  Despite that she became more lethargic and fatigued and was noted to have tachypnea and had labs that were concerning for elevated creatinine and potassium as well as hyperglycemia.  She admits to urinary urgency and dysuria without significant frequency.   Clinical Assessment and Goals of Care: Notes and labs reviewed. Per staff she has been confused. In to see patient. She is resting with eyes closed. No family at bedside. Called daughter.  Daughter states patient has lived alone at baseline and is fully independent except for driving which daughter states she stopped due to concern for reaction time.    We discussed her diagnosis, prognosis, GOC, EOL wishes disposition and options.  Created space and opportunity for patient  to explore thoughts and feelings regarding current medical information.   A detailed discussion was had today regarding advanced directives.  Concepts specific to code status, artifical feeding and hydration, IV antibiotics and rehospitalization were discussed.  The difference between an aggressive medical intervention path and a comfort care path was discussed.  Values and goals of care important to patient and family were attempted  to be elicited.  Discussed limitations of medical interventions to prolong quality of life in some situations and discussed the concept of human mortality.  Daughter states she has had conversations with patient and patient would not be amenable  to any QOL where she is not independent and living alone. She would like full comfort care with no life prolonging care and hospice facility placement at a hospice facility in [place] or [place].   I completed a MOST form today with daughter through [place] and is accessible under ACP tab in Epic. The patient's daughter outlined their wishes for the following treatment decisions:  Cardiopulmonary Resuscitation: Do Not Attempt Resuscitation (DNR/No CPR)  Medical Interventions: Comfort Measures: Keep clean, warm, and dry. Use medication by any route, positioning, wound care, and other measures to relieve pain and suffering. Use oxygen, suction and manual treatment of airway obstruction as needed for comfort. Do not transfer to the hospital unless comfort needs cannot be met in current location.  Antibiotics: No antibiotics (use other measures to relieve symptoms)  IV Fluids: No IV fluids (provide other measures to ensure comfort)  Feeding Tube: No feeding tube       SUMMARY OF RECOMMENDATIONS   Family would like to transition to comfort care and would like [facility]  in [place] or [place].   Prognosis:  < 2 weeks       Primary Diagnoses: Present on Admission:  Sepsis due to urinary tract infection ([place])   I have reviewed the medical record, interviewed the patient and family, and examined the patient. The following aspects are pertinent.  Past Medical History:  Diagnosis Date   Anxiety    Cancer ([place])    skin ca   Coronary artery disease    Diabetes mellitus without complication (HCC)    Hypertension    Social History   Socioeconomic History   Marital status: Divorced    Spouse name: Not on file   Number of children:  Not on file   Years of education: Not on file   Highest education level: Not on file  Occupational History   Not on file  Tobacco Use   Smoking status: Former    Types: Cigarettes    Quit date: 2005    Years since quitting: 19.1   Smokeless tobacco: Never  Vaping Use   Vaping Use: Never used  Substance and Sexual Activity   Alcohol use: No   Drug use: No   Sexual activity: Not on file  Other Topics Concern   Not on file  Social History Narrative   Not on file   Social Determinants of Health   Financial Resource Strain: Not on file  Food Insecurity: Not on file  Transportation Needs: Not on file  Physical Activity: Not on file  Stress: Not on file  Social Connections: Not on file   Family History  Problem Relation Age of Onset   Alzheimer's disease Mother    Migraines Father    Alcohol abuse Father    Breast cancer Neg Hx    Scheduled Meds:  atorvastatin  20 mg Oral Daily   Chlorhexidine Gluconate Cloth  6 each Topical Daily   feeding supplement  237 mL Oral TID BM   folic acid  [id number] mcg Oral Daily   melatonin  2.5 mg Oral QHS   midodrine  10 mg Oral TID WC   multivitamin with minerals  1 tablet Oral Daily   Rivaroxaban  15 mg Oral Q supper   sertraline  50 mg Oral Daily   Continuous Infusions:  sodium chloride Stopped (04/13/22 1038)   norepinephrine (LEVOPHED) Adult infusion Stopped (04/13/22 1154)   PRN Meds:.acetaminophen **OR** acetaminophen, magnesium hydroxide, ondansetron **OR** ondansetron (ZOFRAN) IV, mouth rinse, traZODone Medications Prior to Admission:  Prior to Admission medications   Medication Sig Start Date End Date Taking? Authorizing Provider  atorvastatin (LIPITOR) 20 MG tablet Take 20 mg by mouth daily.   Yes [provider]  conjugated estrogens (PREMARIN) vaginal cream Place 1 applicator vaginally daily.   Yes [provider]  ertapenem (INVANZ) IVPB 500 mg daily. IM inj   Yes [provider]  folic acid  (FOLVITE) [id number] MCG tablet Take 400 mcg by mouth daily.   Yes [provider]  insulin glargine (LANTUS) 100 UNIT/ML injection Inject 5 Units into the skin 2 (two) times daily.   Yes [provider]  insulin lispro (HUMALOG) 100 UNIT/ML injection Inject 1-8 Units into the skin in the morning, at noon, in the evening, and at bedtime. Per sliding scale 160-200 = 1 units 201-240= 2 units 241-280=3 units 281-300= 4 units 301-350=6 units 351-400=8 units  Before meals and at bedtime   Yes [provider]  melatonin 3 MG TABS tablet Take 3 mg by mouth at  bedtime.   Yes [provider]  Multiple Vitamin (MULTIVITAMIN WITH MINERALS) TABS tablet Take 1 tablet by mouth daily.   Yes [provider]  Rivaroxaban (XARELTO) 15 MG TABS tablet Take 15 mg by mouth daily with supper.   Yes [provider]  sertraline (ZOLOFT) 50 MG tablet Take 1 tablet by mouth daily. 08/30/17  Yes [provider]  aspirin EC 325 MG tablet Take 325 mg by mouth daily. Patient not taking: Reported on 04/12/2022    [provider]  metFORMIN (GLUCOPHAGE) 1000 MG tablet Take 1,000 mg by mouth 2 (two) times daily with a meal. Patient not taking: Reported on 03/24/2022    [provider]  lisinopril (PRINIVIL,ZESTRIL) 20 MG tablet Take 20 mg by mouth daily.   08/16/19  [provider]   No Known Allergies Review of Systems  Unable to perform ROS   Physical Exam Pulmonary:     Effort: Pulmonary effort is normal.  Neurological:     Mental Status: She is alert.     Vital Signs: BP (!) 122/45   Pulse (!) 57   Temp 97.9 F (36.6 C) (Axillary)   Resp 16   Ht '5\' 2"'$  (1.575 m)   Wt 49.6 kg   SpO2 100%   BMI 20.00 kg/m  Pain Scale: 0-10   Pain Score: Asleep   SpO2: SpO2: 100 % O2 Device:SpO2: 100 % O2 Flow Rate: .   IO: Intake/output summary:  Intake/Output Summary (Last 24 hours) at 04/13/2022 1553 Last data filed at 04/13/2022  1300 Gross per 24 hour  Intake 718.57 ml  Output 250 ml  Net 468.57 ml    LBM: Last BM Date :  (PTA) Baseline Weight: Weight: 55.3 kg Most recent weight: Weight: 49.6 kg       Signed by: Asencion Gowda, NP   Please contact Palliative Medicine Team phone at [phone] for questions and concerns.  For individual provider: See Shea Evans

## 2022-04-14 DIAGNOSIS — A419 Sepsis, unspecified organism: Secondary | ICD-10-CM | POA: Diagnosis not present

## 2022-04-14 DIAGNOSIS — Z7189 Other specified counseling: Secondary | ICD-10-CM | POA: Diagnosis not present

## 2022-04-14 DIAGNOSIS — N390 Urinary tract infection, site not specified: Secondary | ICD-10-CM | POA: Diagnosis not present

## 2022-04-14 LAB — URINE CULTURE: Culture: 20000 — AB

## 2022-04-14 LAB — THYROID PANEL WITH TSH
Free Thyroxine Index: 0.6 — ABNORMAL LOW (ref 1.2–4.9)
T3 Uptake Ratio: 32 % (ref 24–39)
T4, Total: 2 ug/dL — ABNORMAL LOW (ref 4.5–12.0)
TSH: 1.82 u[IU]/mL (ref 0.450–4.500)

## 2022-04-14 LAB — CARBAPENEM RESISTANCE PANEL
Carba Resistance IMP Gene: NOT DETECTED
Carba Resistance KPC Gene: DETECTED — AB
Carba Resistance NDM Gene: NOT DETECTED
Carba Resistance OXA48 Gene: NOT DETECTED
Carba Resistance VIM Gene: NOT DETECTED

## 2022-04-14 NOTE — Care Management Important Message (Signed)
Important Message  Patient Details  Name: Danielle Vang MRN: [id number] Date of Birth: Oct 15, 1940   Medicare Important Message Given:  Other (see comment)  On comfort care measures.  Medicare IM withheld at this time out of respect for patient and family.   [name] 04/14/2022, 9:17 AM

## 2022-04-14 NOTE — Progress Notes (Signed)
[facility]) Hospice hospital liaison note  Received request from [facility] for family interest in hospice home. Met with patient at bedside and talked with daughter by phone. At this time patient does not meet criteria for the hospice inpatient unit,and would likely be better served by hospice in home or in LTC facility. Discussed with daughter who is in agreement. At this time she would like to determine if a transition back to rehab would be possible.   Liasion will continue to follow to assist in discharge disposition and will re-assess for appropriateness tomorrow.  Please do not hesitate to call with any hospice related questions or concerns.   Thank you for the opportunity to participate in this patient's care.  Jhonnie Garner, Therapist, sports, [facility] Liaison  [phone]

## 2022-04-14 NOTE — Plan of Care (Signed)
  Problem: Education: Goal: Ability to describe self-care measures that may prevent or decrease complications (Diabetes Survival Skills Education) will improve Outcome: Progressing Goal: Individualized Educational Video(s) Outcome: Progressing   Problem: Coping: Goal: Ability to adjust to condition or change in health will improve Outcome: Progressing   Problem: Fluid Volume: Goal: Ability to maintain a balanced intake and output will improve Outcome: Progressing   Problem: Health Behavior/Discharge Planning: Goal: Ability to identify and utilize available resources and services will improve Outcome: Progressing Goal: Ability to manage health-related needs will improve Outcome: Progressing   Problem: Metabolic: Goal: Ability to maintain appropriate glucose levels will improve Outcome: Progressing   Problem: Nutritional: Goal: Maintenance of adequate nutrition will improve Outcome: Progressing Goal: Progress toward achieving an optimal weight will improve Outcome: Progressing   Problem: Skin Integrity: Goal: Risk for impaired skin integrity will decrease Outcome: Progressing   Problem: Tissue Perfusion: Goal: Adequacy of tissue perfusion will improve Outcome: Progressing   Problem: Fluid Volume: Goal: Hemodynamic stability will improve Outcome: Progressing   Problem: Clinical Measurements: Goal: Diagnostic test results will improve Outcome: Progressing Goal: Signs and symptoms of infection will decrease Outcome: Progressing   Problem: Respiratory: Goal: Ability to maintain adequate ventilation will improve Outcome: Progressing   Problem: Education: Goal: Knowledge of General Education information will improve Description: Including pain rating scale, medication(s)/side effects and non-pharmacologic comfort measures Outcome: Progressing   Problem: Health Behavior/Discharge Planning: Goal: Ability to manage health-related needs will improve Outcome:  Progressing   Problem: Clinical Measurements: Goal: Ability to maintain clinical measurements within normal limits will improve Outcome: Progressing Goal: Will remain free from infection Outcome: Progressing Goal: Diagnostic test results will improve Outcome: Progressing Goal: Respiratory complications will improve Outcome: Progressing Goal: Cardiovascular complication will be avoided Outcome: Progressing   Problem: Activity: Goal: Risk for activity intolerance will decrease Outcome: Progressing   Problem: Nutrition: Goal: Adequate nutrition will be maintained Outcome: Progressing   Problem: Coping: Goal: Level of anxiety will decrease Outcome: Progressing   Problem: Elimination: Goal: Will not experience complications related to bowel motility Outcome: Progressing Goal: Will not experience complications related to urinary retention Outcome: Progressing   Problem: Pain Managment: Goal: General experience of comfort will improve Outcome: Progressing   Problem: Safety: Goal: Ability to remain free from injury will improve Outcome: Progressing   Problem: Skin Integrity: Goal: Risk for impaired skin integrity will decrease Outcome: Progressing   Problem: Education: Goal: Knowledge of the prescribed therapeutic regimen will improve Outcome: Progressing   Problem: Coping: Goal: Ability to identify and develop effective coping behavior will improve Outcome: Progressing   Problem: Clinical Measurements: Goal: Quality of life will improve Outcome: Progressing   Problem: Respiratory: Goal: Verbalizations of increased ease of respirations will increase Outcome: Progressing   Problem: Role Relationship: Goal: Family's ability to cope with current situation will improve Outcome: Progressing Goal: Ability to verbalize concerns, feelings, and thoughts to partner or family member will improve Outcome: Progressing   Problem: Pain Management: Goal: Satisfaction with  pain management regimen will improve Outcome: Progressing

## 2022-04-14 NOTE — Progress Notes (Signed)
Daily Progress Note   Patient Name: Danielle Vang       Date: 04/14/2022 DOB: March 22, 1940  Age: 82 y.o. MRN#: [id number] Attending Physician: Enzo Bi, MD Primary Care Physician: Gustavo Lah, MD Admit Date: 04/04/2022  Reason for Consultation/Follow-up: Establishing goals of care and Terminal Care  Subjective: Notes and labs reviewed. In to see patient. She is currently resting in bed with no family at bedside. No distress noted. Even and unlabored respirations.   No change to comfort meds recommended at this time. Patient continues to await discharge with hospice care.    Length of Stay: 4  Current Medications: Scheduled Meds:   Chlorhexidine Gluconate Cloth  6 each Topical Daily   feeding supplement  237 mL Oral TID BM   melatonin  2.5 mg Oral QHS   sertraline  50 mg Oral Daily    Continuous Infusions:   PRN Meds: acetaminophen **OR** acetaminophen, antiseptic oral rinse, glycopyrrolate **OR** glycopyrrolate **OR** glycopyrrolate, haloperidol **OR** haloperidol **OR** haloperidol lactate, LORazepam **OR** LORazepam **OR** LORazepam, magnesium hydroxide, morphine injection, ondansetron **OR** ondansetron (ZOFRAN) IV, mouth rinse, polyvinyl alcohol, traZODone  Physical Exam Constitutional:      Comments: Eyes closed.   Pulmonary:     Effort: Pulmonary effort is normal.             Vital Signs: BP (!) 114/52 (BP Location: Left Arm)   Pulse 60   Temp 97.7 F (36.5 C) (Oral)   Resp 18   Ht '5\' 2"'$  (1.575 m)   Wt 49.6 kg   SpO2 100%   BMI 20.00 kg/m  SpO2: SpO2: 100 % O2 Device: O2 Device: Room Air O2 Flow Rate:    Intake/output summary:  Intake/Output Summary (Last 24 hours) at 04/14/2022 1419 Last data filed at 04/14/2022 [id number] Gross per 24 hour   Intake --  Output 850 ml  Net -850 ml   LBM: Last BM Date :  (PTA) Baseline Weight: Weight: 55.3 kg Most recent weight: Weight: 49.6 kg        Patient Active Problem List   Diagnosis Date Noted   Protein-calorie malnutrition, severe 04/12/2022   Hyponatremia 04/11/2022   Sepsis due to urinary tract infection ([place]) 04/01/2022   CAP (community acquired pneumonia) 03/23/2022   Essential hypertension 03/20/2022   AKI (acute kidney injury) ([place])  04/05/2022   Type 2 diabetes mellitus without complications ([place]) [id number]   Paroxysmal atrial fibrillation ([place]) 04/11/2022   Dyslipidemia 03/25/2022   Coronary artery disease 11/29/2017   DDD (degenerative disc disease), cervical 11/29/2017   Hyperlipidemia 11/29/2017   Hypertension 11/29/2017   Neck pain 11/29/2017   Osteoporosis 11/29/2017   Pain in thoracic spine 11/29/2017   Senile nuclear sclerosis 11/29/2017   Vertigo, benign positional 11/29/2017   UTI (urinary tract infection) 11/09/2017   Spinal stenosis of lumbar region without neurogenic claudication 09/05/2017   Uncontrolled type 2 diabetes mellitus with circulatory disorder, with long-term current use of insulin 10/16/2013   Ischial bursitis of right side 07/03/2013   Diastasis recti 10/02/2012   Depression 07/13/2011    Palliative Care Assessment & Plan   Recommendations/Plan: No change recommended to comfort meds. Patient awaiting discharge with hospice care.   Code Status:    Code Status Orders  (From admission, onward)           Start     Ordered   04/13/22 1609  Do not attempt resuscitation (DNR)  Continuous       Question Answer Comment  If patient has no pulse and is not breathing Do Not Attempt Resuscitation   If patient has a pulse and/or is breathing: Medical Treatment Goals COMFORT MEASURES: Keep clean/warm/dry, use medication by any route; positioning, wound care and other measures to relieve pain/suffering; use oxygen, suction/manual  treatment of airway obstruction for comfort; do not transfer unless for comfort needs.   Consent: Discussion documented in EHR or advanced directives reviewed      04/13/22 1610           Code Status History     Date Active Date Inactive Code Status Order ID Comments User Context   04/11/2022 1016 04/13/2022 1610 DNR [id number]  Emeterio Reeve, DO Inpatient   03/17/2022 2311 04/11/2022 1016 Full Code [id number]  Sidney Ace Arvella Merles, MD ED   11/09/2017 1742 11/13/2017 1635 DNR [id number]  Hillary Bow, MD ED       Prognosis:  < 2 weeks    Thank you for allowing the Palliative Medicine Team to assist in the care of this patient.    Asencion Gowda, NP  Please contact Palliative Medicine Team phone at [phone] for questions and concerns.

## 2022-04-14 NOTE — Progress Notes (Addendum)
PROGRESS NOTE    Danielle Vang  [id number] DOB: 04-Jun-1940 DOA: 03/20/2022 PCP: Gustavo Lah, MD  227A/[id number]-[id number]  LOS: 4 days   Brief hospital course:   Assessment & Plan: Danielle Vang is a 82 y.o. Caucasian female with medical history significant for type 2 diabetes mellitus, hypertension, coronary artery disease paroxysmal atrial fibrillation and anxiety, who presented to the ER with acute onset of suspected sepsis from her SNF.  The patient is a history of recurrent UTIs and was diagnosed with 1 a couple days ago and was started on antibiotics.  Despite that she became more lethargic and fatigued and was noted to have tachypnea and had labs that were concerning for elevated creatinine and potassium as well as hyperglycemia.  She admits to urinary urgency and dysuria without significant frequency.    Acute metabolic encephalopathy  Failure to thrive --was lethargic and fatigued at SNF.  AMS more likely due to severe dehydration.  Pt also presented with signs of severe malnutrition, so likely has had poor oral intake for a while PTA. --made comfort care on 2/28  AKI (acute kidney injury) ([place]) - improving  Cr 2.67, BUN 71 on presentation.  Cr 1.69 on 03/31/22. D/t severe dehydration, volume depletion. --d/c'ed MIVF since pt is comfort care now  Sepsis, ruled out  Possible UTI --urine cx only 20,000 colonies.  Completed 3 days of cefepime/ceftriaxone  Hypotension Given significant AKI and lactate, suspect hypovolemia --s/p IVF, received pressor support in ICU, started on midodrine. --weaned off pressor on 2/28 --d/c'ed midodrine since pt is comfort care status now  Hyponatremia, resolved Likely hypovolemic Improved w/ IV fluids   CAP, ruled out --evidence of PNA not convincing --d/c'ed abx for CAP   Hx Essential hypertension --BP medication held since hypotensive   Anemia   Type 2 diabetes mellitus, poorly controlled --A1c 9.6. --d/c'ed BG  checks now pt is comfort care   Paroxysmal atrial fibrillation (HCC) --d/c'ed Xarelto now pt is comfort care   Dyslipidemia --d/c'ed statin therapy now pt is comfort care   Protein calorie malnutrition, severe  Hypomag --monitor and replete PRN   DVT prophylaxis: None:Comfort Care Code Status: DNR  Family Communication:  Level of care: Med-Surg Dispo:   The patient is from: SNF rehab Anticipated d/c is to: hospice facility Anticipated d/c date is: whenever bed available   Subjective and Interval History:  RN reported pt ate bits of food and sips of water.   Objective: Vitals:   04/13/22 1600 04/13/22 1744 04/13/22 2043 04/14/22 1023  BP: (!) 114/46 (!) 116/48 (!) 112/52 (!) 114/52  Pulse: (!) 49 (!) 105 (!) 58 60  Resp: '14 20 15 18  '$ Temp:  98 F (36.7 C) 97.7 F (36.5 C)   TempSrc:   Oral   SpO2: 97% 95% 96% 100%  Weight:      Height:        Intake/Output Summary (Last 24 hours) at 04/14/2022 1915 Last data filed at 04/14/2022 0622 Gross per 24 hour  Intake --  Output 850 ml  Net -850 ml   Filed Weights   04/12/2022 1856 04/13/22 0800  Weight: 55.3 kg 49.6 kg    Examination:   Constitutional: NAD, alert, not oriented HEENT: conjunctivae and lids normal, EOMI CV: No cyanosis.   RESP: normal respiratory effort, on RA   Data Reviewed: I have personally reviewed labs and imaging studies  Time spent: 25 minutes  Enzo Bi, MD [facility] If 7PM-7AM, please contact night-coverage  04/14/2022, 7:15 PM

## 2022-04-15 DIAGNOSIS — N390 Urinary tract infection, site not specified: Secondary | ICD-10-CM | POA: Diagnosis not present

## 2022-04-15 DIAGNOSIS — A419 Sepsis, unspecified organism: Secondary | ICD-10-CM | POA: Diagnosis not present

## 2022-04-15 LAB — CULTURE, BLOOD (ROUTINE X 2)
Culture: NO GROWTH
Culture: NO GROWTH

## 2022-04-15 NOTE — Progress Notes (Signed)
PROGRESS NOTE    Danielle Vang  [id number] DOB: September 06, 1940 DOA: 04/12/2022 PCP: Gustavo Lah, MD  227A/227A-AA  LOS: 5 days   Brief hospital course:   Assessment & Plan: Danielle Vang is a 82 y.o. Caucasian female with medical history significant for type 2 diabetes mellitus, hypertension, coronary artery disease paroxysmal atrial fibrillation and anxiety, who presented to the ER with acute onset of suspected sepsis from her SNF.  The patient is a history of recurrent UTIs and was diagnosed with 1 a couple days ago and was started on antibiotics.  Despite that she became more lethargic and fatigued and was noted to have tachypnea and had labs that were concerning for elevated creatinine and potassium as well as hyperglycemia.  She admits to urinary urgency and dysuria without significant frequency.    Acute metabolic encephalopathy  Failure to thrive --was lethargic and fatigued at SNF.  AMS more likely due to severe dehydration.  Pt also presented with signs of severe malnutrition, so likely has had poor oral intake for a while PTA. --made comfort care on 2/28  AKI (acute kidney injury) ([place]) - improving  Cr 2.67, BUN 71 on presentation.  Cr 1.69 on 03/31/22. D/t severe dehydration, volume depletion. --d/c'ed MIVF since pt is comfort care now  Sepsis, ruled out  Possible UTI --urine cx only 20,000 colonies.  Completed 3 days of cefepime/ceftriaxone  Hypotension Given significant AKI and lactate, suspect hypovolemia --s/p IVF, received pressor support in ICU, started on midodrine. --weaned off pressor on 2/28 --d/c'ed midodrine since pt is comfort care status now  Hyponatremia, resolved Likely hypovolemic Improved w/ IV fluids   CAP, ruled out --evidence of PNA not convincing --d/c'ed abx for CAP   Hx Essential hypertension --BP medication held since hypotensive   Anemia   Type 2 diabetes mellitus, poorly controlled --A1c 9.6. --d/c'ed BG  checks now pt is comfort care   Paroxysmal atrial fibrillation (HCC) --d/c'ed Xarelto now pt is comfort care   Dyslipidemia --d/c'ed statin therapy now pt is comfort care   Protein calorie malnutrition, severe  Hypomag --monitor and replete PRN   DVT prophylaxis: None:Comfort Care Code Status: DNR  Family Communication: daughter updated at bedside today Level of care: Med-Surg Dispo:   The patient is from: SNF rehab Anticipated d/c is to: to be determined Anticipated d/c date is: whenever bed available   Subjective and Interval History:  Pt was declined inpatient hospice admission.   Objective: Vitals:   04/14/22 1023 04/14/22 2154 04/15/22 0828 04/15/22 1612  BP: (!) 114/52 (!) 102/54 (!) 113/54 117/60  Pulse: 60 73 72 75  Resp: '18 18 16 18  '$ Temp:  97.8 F (36.6 C) 98.3 F (36.8 C)   TempSrc:  Oral Oral   SpO2: 100% 100% 97% 93%  Weight:      Height:        Intake/Output Summary (Last 24 hours) at 04/15/2022 1944 Last data filed at 04/15/2022 1600 Gross per 24 hour  Intake 0 ml  Output 125 ml  Net -125 ml   Filed Weights   03/25/2022 1856 04/13/22 0800  Weight: 55.3 kg 49.6 kg    Examination:   Constitutional: NAD, awake, not oriented HEENT: conjunctivae and lids normal, EOMI CV: No cyanosis.   RESP: normal respiratory effort, on RA   Data Reviewed: I have personally reviewed labs and imaging studies  Time spent: 25 minutes  Enzo Bi, MD [facility] If 7PM-7AM, please contact night-coverage 04/15/2022, 7:44 PM

## 2022-04-15 NOTE — Care Management Important Message (Signed)
Important Message  Patient Details  Name: Danielle Vang MRN: [id number] Date of Birth: [birth year]   Medicare Important Message Given:  Other (see comment)  Patient is on comfort care and will discharge with hospice. Out of respect for the patient and family no Important Message from [facility] given.   Juliann Pulse A Carman Essick 04/15/2022, 10:52 AM

## 2022-04-15 NOTE — TOC Progression Note (Addendum)
Transition of Care [facility]) - Progression Note    Patient Details  Name: Danielle Vang MRN: [id number] Date of Birth: [birth year]  Transition [facility]) CM/SW Contact  Ross Ludwig, [place] Phone Number: 04/15/2022, 11:30 AM  Clinical Narrative:     CSW left message with [facility] to see if patient is able to return with hospice services or if she will be under rehab.  CSW awaiting for a call back.  5:00pm CSW still waiting for a call back from SNF, left another message.  TOC to continue to follow patient's progress throughout discharge planning.        Expected Discharge Plan and Services                                               Social Determinants of Health (SDOH) Interventions SDOH Screenings   Tobacco Use: Medium Risk (03/18/2022)    Readmission Risk Interventions     No data to display

## 2022-04-15 DEATH — deceased

## 2022-04-16 DIAGNOSIS — A419 Sepsis, unspecified organism: Secondary | ICD-10-CM | POA: Diagnosis not present

## 2022-04-16 DIAGNOSIS — N390 Urinary tract infection, site not specified: Secondary | ICD-10-CM | POA: Diagnosis not present

## 2022-04-16 NOTE — Progress Notes (Signed)
  PROGRESS NOTE    Danielle Vang  [id number] DOB: 1941-02-08 DOA: 03/21/2022 PCP: Gustavo Lah, MD  227A/227A-AA  LOS: 6 days   Brief hospital course:   Assessment & Plan: Danielle Vang is a 82 y.o. Caucasian female with medical history significant for type 2 diabetes mellitus, hypertension, coronary artery disease paroxysmal atrial fibrillation and anxiety, who presented to the ER with acute onset of suspected sepsis from her SNF.  The patient is a history of recurrent UTIs and was diagnosed with 1 a couple days ago and was started on antibiotics.  Despite that she became more lethargic and fatigued and was noted to have tachypnea and had labs that were concerning for elevated creatinine and potassium as well as hyperglycemia.  She admits to urinary urgency and dysuria without significant frequency.    Acute metabolic encephalopathy  Failure to thrive --was lethargic and fatigued at SNF.  AMS more likely due to severe dehydration.  Pt also presented with signs of severe malnutrition, so likely has had poor oral intake for a while PTA. --made comfort care on 2/28  AKI (acute kidney injury) ([place]) - improving  Cr 2.67, BUN 71 on presentation.  Cr 1.69 on 03/31/22. D/t severe dehydration, volume depletion. --d/c'ed MIVF since pt is comfort care now  Sepsis, ruled out  Possible UTI --urine cx only 20,000 colonies.  Completed 3 days of cefepime/ceftriaxone  Hypotension Given significant AKI and lactate, suspect hypovolemia --s/p IVF, received pressor support in ICU, started on midodrine. --weaned off pressor on 2/28 --d/c'ed midodrine since pt is comfort care status now  Hyponatremia, resolved Likely hypovolemic Improved w/ IV fluids   CAP, ruled out --evidence of PNA not convincing --d/c'ed abx for CAP   Hx Essential hypertension --BP medication held since hypotensive   Anemia   Type 2 diabetes mellitus, poorly controlled --A1c 9.6. --d/c'ed BG  checks now pt is comfort care   Paroxysmal atrial fibrillation (HCC) --d/c'ed Xarelto now pt is comfort care   Dyslipidemia --d/c'ed statin therapy now pt is comfort care   Protein calorie malnutrition, severe  Hypomag --monitor and replete PRN   DVT prophylaxis: None:Comfort Care Code Status: DNR  Family Communication: daughter updated at bedside today Level of care: Med-Surg Dispo:   The patient is from: SNF rehab Anticipated d/c is to: to be determined Anticipated d/c date is: whenever bed available   Subjective and Interval History:  No change in pt status.   Objective: Vitals:   04/15/22 0828 04/15/22 1612 04/16/22 0516 04/16/22 0829  BP: (!) 113/54 117/60 (!) 140/75 125/74  Pulse: 72 75 85 83  Resp: '16 18 15 18  '$ Temp: 98.3 F (36.8 C)  98.2 F (36.8 C)   TempSrc: Oral     SpO2: 97% 93% 100% 99%  Weight:      Height:        Intake/Output Summary (Last 24 hours) at 04/16/2022 1529 Last data filed at 04/15/2022 1600 Gross per 24 hour  Intake --  Output 125 ml  Net -125 ml   Filed Weights   04/06/2022 1856 04/13/22 0800  Weight: 55.3 kg 49.6 kg    Examination:   Constitutional: NAD, lethargic, not oriented CV: No cyanosis.   RESP: normal respiratory effort, on RA   Data Reviewed: I have personally reviewed labs and imaging studies  Time spent: 25 minutes  Danielle Bi, MD [facility] If 7PM-7AM, please contact night-coverage 04/16/2022, 3:29 PM

## 2022-04-16 NOTE — Plan of Care (Signed)
  Problem: Education: Goal: Ability to describe self-care measures that may prevent or decrease complications (Diabetes Survival Skills Education) will improve Outcome: Progressing Goal: Individualized Educational Video(s) Outcome: Progressing   Problem: Coping: Goal: Ability to adjust to condition or change in health will improve Outcome: Progressing   Problem: Fluid Volume: Goal: Ability to maintain a balanced intake and output will improve Outcome: Progressing   Problem: Health Behavior/Discharge Planning: Goal: Ability to identify and utilize available resources and services will improve Outcome: Progressing Goal: Ability to manage health-related needs will improve Outcome: Progressing   Problem: Metabolic: Goal: Ability to maintain appropriate glucose levels will improve Outcome: Progressing   Problem: Nutritional: Goal: Maintenance of adequate nutrition will improve Outcome: Progressing Goal: Progress toward achieving an optimal weight will improve Outcome: Progressing   Problem: Skin Integrity: Goal: Risk for impaired skin integrity will decrease Outcome: Progressing   Problem: Tissue Perfusion: Goal: Adequacy of tissue perfusion will improve Outcome: Progressing   Problem: Fluid Volume: Goal: Hemodynamic stability will improve Outcome: Progressing   Problem: Clinical Measurements: Goal: Diagnostic test results will improve Outcome: Progressing Goal: Signs and symptoms of infection will decrease Outcome: Progressing   Problem: Respiratory: Goal: Ability to maintain adequate ventilation will improve Outcome: Progressing   Problem: Education: Goal: Knowledge of General Education information will improve Description: Including pain rating scale, medication(s)/side effects and non-pharmacologic comfort measures Outcome: Progressing   Problem: Health Behavior/Discharge Planning: Goal: Ability to manage health-related needs will improve Outcome:  Progressing   Problem: Clinical Measurements: Goal: Ability to maintain clinical measurements within normal limits will improve Outcome: Progressing Goal: Will remain free from infection Outcome: Progressing Goal: Diagnostic test results will improve Outcome: Progressing Goal: Respiratory complications will improve Outcome: Progressing Goal: Cardiovascular complication will be avoided Outcome: Progressing   Problem: Activity: Goal: Risk for activity intolerance will decrease Outcome: Progressing   Problem: Nutrition: Goal: Adequate nutrition will be maintained Outcome: Progressing   Problem: Coping: Goal: Level of anxiety will decrease Outcome: Progressing   Problem: Elimination: Goal: Will not experience complications related to bowel motility Outcome: Progressing Goal: Will not experience complications related to urinary retention Outcome: Progressing   Problem: Pain Managment: Goal: General experience of comfort will improve Outcome: Progressing   Problem: Safety: Goal: Ability to remain free from injury will improve Outcome: Progressing   Problem: Skin Integrity: Goal: Risk for impaired skin integrity will decrease Outcome: Progressing   Problem: Education: Goal: Knowledge of the prescribed therapeutic regimen will improve Outcome: Progressing   Problem: Coping: Goal: Ability to identify and develop effective coping behavior will improve Outcome: Progressing   Problem: Clinical Measurements: Goal: Quality of life will improve Outcome: Progressing   Problem: Respiratory: Goal: Verbalizations of increased ease of respirations will increase Outcome: Progressing   Problem: Role Relationship: Goal: Family's ability to cope with current situation will improve Outcome: Progressing Goal: Ability to verbalize concerns, feelings, and thoughts to partner or family member will improve Outcome: Progressing   Problem: Pain Management: Goal: Satisfaction with  pain management regimen will improve Outcome: Progressing   

## 2022-04-16 NOTE — Progress Notes (Signed)
Manufacturing systems engineer Liaison Note  Follow up on referral for InPatient Unit (IPU) on 2.29.24 and patient was not appropriate.  Patient is on comfort care now and has been moved to room 227.  Patient awake and is pleasantly confused.  She ate about 35% of her breakfast today.  TOC continues to wait for call back from [facility] to see if she is able to return for rehab.  If patient returns to [facility] on LTC days and in need of hospice services, [facility] will determine which agency will serve the patient.  [facility] has hospice services and does not allow [facility] in their buildings.  TOC, Larene Beach [place] updated on this information.  Hospital liaison team will continue to follow through final discharge disposition.  Please do not hesitate to call with any hospice questions or concerns.  Dimas Aguas, RN [phone]

## 2022-04-16 NOTE — Progress Notes (Signed)
CSW contacted admissions with [facility] and left a message requesting a call back. CSW also contacted the main number with no answer.

## 2022-04-17 DIAGNOSIS — A419 Sepsis, unspecified organism: Secondary | ICD-10-CM | POA: Diagnosis not present

## 2022-04-17 DIAGNOSIS — N390 Urinary tract infection, site not specified: Secondary | ICD-10-CM | POA: Diagnosis not present

## 2022-04-17 NOTE — Plan of Care (Signed)
Problem: Education: Goal: Individualized Educational Video(s) Outcome: Progressing   Problem: Fluid Volume: Goal: Ability to maintain a balanced intake and output will improve Outcome: Progressing   Problem: Metabolic: Goal: Ability to maintain appropriate glucose levels will improve Outcome: Progressing   Problem: Nutritional: Goal: Maintenance of adequate nutrition will improve Outcome: Progressing Goal: Progress toward achieving an optimal weight will improve Outcome: Progressing   Problem: Skin Integrity: Goal: Risk for impaired skin integrity will decrease Outcome: Progressing   Problem: Tissue Perfusion: Goal: Adequacy of tissue perfusion will improve Outcome: Progressing   Problem: Fluid Volume: Goal: Hemodynamic stability will improve Outcome: Progressing   Problem: Clinical Measurements: Goal: Diagnostic test results will improve Outcome: Progressing Goal: Signs and symptoms of infection will decrease Outcome: Progressing   Problem: Respiratory: Goal: Ability to maintain adequate ventilation will improve Outcome: Progressing   Problem: Clinical Measurements: Goal: Ability to maintain clinical measurements within normal limits will improve Outcome: Progressing Goal: Will remain free from infection Outcome: Progressing Goal: Diagnostic test results will improve Outcome: Progressing Goal: Respiratory complications will improve Outcome: Progressing Goal: Cardiovascular complication will be avoided Outcome: Progressing   Problem: Activity: Goal: Risk for activity intolerance will decrease Outcome: Progressing   Problem: Nutrition: Goal: Adequate nutrition will be maintained Outcome: Progressing   Problem: Coping: Goal: Level of anxiety will decrease Outcome: Progressing   Problem: Elimination: Goal: Will not experience complications related to bowel motility Outcome: Progressing Goal: Will not experience complications related to urinary  retention Outcome: Progressing   Problem: Pain Managment: Goal: General experience of comfort will improve Outcome: Progressing   Problem: Safety: Goal: Ability to remain free from injury will improve Outcome: Progressing   Problem: Skin Integrity: Goal: Risk for impaired skin integrity will decrease Outcome: Progressing   Problem: Clinical Measurements: Goal: Quality of life will improve Outcome: Progressing   Problem: Respiratory: Goal: Verbalizations of increased ease of respirations will increase Outcome: Progressing   Problem: Role Relationship: Goal: Family's ability to cope with current situation will improve Outcome: Progressing   Problem: Pain Management: Goal: Satisfaction with pain management regimen will improve Outcome: Progressing   Problem: Education: Goal: Ability to describe self-care measures that may prevent or decrease complications (Diabetes Survival Skills Education) will improve Outcome: Not Progressing Note: Patient experiencing confusion at this time. Will continue to reorient and educate as needed.   Problem: Coping: Goal: Ability to adjust to condition or change in health will improve Outcome: Not Progressing Note: Patient experiencing confusion at this time. Will continue to reorient and educate as needed.   Problem: Health Behavior/Discharge Planning: Goal: Ability to identify and utilize available resources and services will improve Outcome: Not Progressing Note: Patient experiencing confusion at this time. Will continue to reorient and educate as needed. Goal: Ability to manage health-related needs will improve Outcome: Not Progressing Note: Patient experiencing confusion at this time. Will continue to reorient and educate as needed.   Problem: Education: Goal: Knowledge of General Education information will improve Description: Including pain rating scale, medication(s)/side effects and non-pharmacologic comfort measures Outcome: Not  Progressing Note: Patient experiencing confusion at this time. Will continue to reorient and educate as needed.   Problem: Health Behavior/Discharge Planning: Goal: Ability to manage health-related needs will improve Outcome: Not Progressing Note: Patient experiencing confusion at this time. Will continue to reorient and educate as needed.   Problem: Education: Goal: Knowledge of the prescribed therapeutic regimen will improve Outcome: Not Progressing Note: Patient experiencing confusion at this time. Will continue to reorient and educate as  needed.   Problem: Coping: Goal: Ability to identify and develop effective coping behavior will improve Outcome: Not Progressing Note: Patient experiencing confusion at this time. Will continue to reorient and educate as needed.   Problem: Role Relationship: Goal: Ability to verbalize concerns, feelings, and thoughts to partner or family member will improve Outcome: Not Progressing Note: Patient experiencing confusion at this time. Will continue to reorient and educate as needed.

## 2022-04-17 NOTE — Progress Notes (Signed)
  PROGRESS NOTE    Danielle Vang  [id number] DOB: 1940-11-26 DOA: 04/03/2022 PCP: Gustavo Lah, MD  227A/227A-AA  LOS: 7 days   Brief hospital course:   Assessment & Plan: [name] is a 82 y.o. Caucasian female with medical history significant for type 2 diabetes mellitus, hypertension, coronary artery disease paroxysmal atrial fibrillation and anxiety, who presented to the ER with acute onset of suspected sepsis from her SNF.  The patient is a history of recurrent UTIs and was diagnosed with 1 a couple days ago and was started on antibiotics.  Despite that she became more lethargic and fatigued and was noted to have tachypnea and had labs that were concerning for elevated creatinine and potassium as well as hyperglycemia.  She admits to urinary urgency and dysuria without significant frequency.    Acute metabolic encephalopathy  Failure to thrive --was lethargic and fatigued at SNF.  AMS more likely due to severe dehydration.  Pt also presented with signs of severe malnutrition, so likely has had poor oral intake for a while PTA. --made comfort care on 2/28  AKI (acute kidney injury) ([place]) - improving  Cr 2.67, BUN 71 on presentation.  Cr 1.69 on 03/31/22. D/t severe dehydration, volume depletion. --d/c'ed MIVF since pt is comfort care now  Sepsis, ruled out  Possible UTI --urine cx only 20,000 colonies.  Completed 3 days of cefepime/ceftriaxone  Hypotension Given significant AKI and lactate, suspect hypovolemia --s/p IVF, received pressor support in ICU, started on midodrine. --weaned off pressor on 2/28 --d/c'ed midodrine since pt is comfort care status now  Hyponatremia, resolved Likely hypovolemic Improved w/ IV fluids   CAP, ruled out --evidence of PNA not convincing --d/c'ed abx for CAP   Hx Essential hypertension --BP medication held since hypotensive   Anemia   Type 2 diabetes mellitus, poorly controlled --A1c 9.6. --d/c'ed BG  checks now pt is comfort care   Paroxysmal atrial fibrillation (HCC) --d/c'ed Xarelto now pt is comfort care   Dyslipidemia --d/c'ed statin therapy now pt is comfort care   Protein calorie malnutrition, severe  Hypomag --monitor and replete PRN   DVT prophylaxis: None:Comfort Care Code Status: DNR  Family Communication:  Level of care: Med-Surg Dispo:   The patient is from: SNF rehab Anticipated d/c is to: to be determined.  Family can't take pt home. Anticipated d/c date is: whenever bed available   Subjective and Interval History:  No change in pt status.     Objective: Vitals:   04/16/22 0829 04/16/22 1829 04/17/22 0511 04/17/22 0750  BP: 125/74 (!) 144/73 (!) 151/85 136/68  Pulse: 83 (!) 103 (!) 118 (!) 125  Resp: '18 18 17 18  '$ Temp:   98.9 F (37.2 C)   TempSrc:   Oral   SpO2: 99% 100% 97% 97%  Weight:      Height:        Intake/Output Summary (Last 24 hours) at 04/17/2022 1256 Last data filed at 04/17/2022 1100 Gross per 24 hour  Intake 240 ml  Output 900 ml  Net -660 ml   Filed Weights   03/27/2022 1856 04/13/22 0800  Weight: 55.3 kg 49.6 kg    Examination:   Constitutional: NAD, lethargic, not oriented CV: No cyanosis.   RESP: normal respiratory effort, on RA   Data Reviewed: I have personally reviewed labs and imaging studies  Time spent: 25 minutes  Enzo Bi, MD [facility] If 7PM-7AM, please contact night-coverage 04/17/2022, 12:56 PM

## 2022-04-17 NOTE — Progress Notes (Signed)
[facility]) Hospital Liaison Note  [facility] continues to follow for discharge disposition.  Please call with any hospice or outpatient palliative needs.  Thank you, Margaretmary Eddy, BSN, RN [facility] Liaison [phone]

## 2022-04-17 NOTE — TOC Transition Note (Signed)
Transition of Care [facility]) - CM/SW Discharge Note   Patient Details  Name: Danielle Vang MRN: [id number] Date of Birth: August 02, 1940  Transition of Care [facility]) CM/SW Contact:  Raina Mina, [place] Phone Number: 04/17/2022, 4:05 PM   Clinical Narrative:   CSW unable to get in touch with admissions director at [facility]. CSW also contacted the main number with no response.           Patient Goals and CMS Choice      Discharge Placement                         Discharge Plan and Services Additional resources added to the After Visit Summary for                                       Social Determinants of Health (SDOH) Interventions SDOH Screenings   Tobacco Use: Medium Risk (04/09/2022)     Readmission Risk Interventions     No data to display

## 2022-04-18 DIAGNOSIS — A419 Sepsis, unspecified organism: Secondary | ICD-10-CM | POA: Diagnosis not present

## 2022-04-18 DIAGNOSIS — N390 Urinary tract infection, site not specified: Secondary | ICD-10-CM | POA: Diagnosis not present

## 2022-04-20 DIAGNOSIS — G9341 Metabolic encephalopathy: Secondary | ICD-10-CM | POA: Insufficient documentation

## 2022-04-20 DIAGNOSIS — I959 Hypotension, unspecified: Secondary | ICD-10-CM | POA: Insufficient documentation

## 2022-04-20 DIAGNOSIS — E639 Nutritional deficiency, unspecified: Secondary | ICD-10-CM | POA: Insufficient documentation

## 2022-04-20 DIAGNOSIS — R571 Hypovolemic shock: Secondary | ICD-10-CM | POA: Insufficient documentation

## 2022-04-20 DIAGNOSIS — D649 Anemia, unspecified: Secondary | ICD-10-CM | POA: Insufficient documentation

## 2022-04-20 DIAGNOSIS — R627 Adult failure to thrive: Secondary | ICD-10-CM | POA: Insufficient documentation

## 2022-05-16 NOTE — Discharge Summary (Signed)
Death Summary  Danielle Vang [id number] DOB: January 06, 1941 DOA: 04-13-2022  PCP: Gustavo Lah, MD  Admit date: 04/13/22 Date of Death: 2022/04/21 Time of Death: 14:00 Notification: Gustavo Lah, MD notified of death of 2022/04/21   History of present illness:  Danielle Vang is a 82 y.o. female with a history of  Danielle Vang presented with complaint of  Danielle Vang did not improve after ***(very brief description of intervention) (type brief HPI or brief interim summary & hospital course depending on length of stay; Was pt made DNR/comfort care?)  Discharge Diagnoses:  Principal Problem:   Sepsis due to urinary tract infection ([place]) Active Problems:   AKI (acute kidney injury) ([place])   CAP (community acquired pneumonia)   Hyponatremia   Essential hypertension   Type 2 diabetes mellitus without complications (HCC)   Paroxysmal atrial fibrillation (HCC)   Dyslipidemia   Protein-calorie malnutrition, severe    The results of significant diagnostics from this hospitalization (including imaging, microbiology, ancillary and laboratory) are listed below for reference.    Significant Diagnostic Studies: DG Chest Port 1 View  Result Date: 04/12/2022 CLINICAL DATA:  Pneumonia EXAM: PORTABLE CHEST 1 VIEW COMPARISON:  2022/04/13 FINDINGS: Normal mediastinum and cardiac silhouette. Chronic central bronchitic markings. Streaky opacity in the RIGHT lower lobe is slightly more dense. Normal pulmonary vasculature. No effusion, infiltrate, or pneumothorax. IMPRESSION: 1. Streaky opacity in the RIGHT lower lobe could represent atelectasis or pneumonia. 2. Chronic bronchitic markings. Electronically Signed   By: Suzy Bouchard M.D.   On: 04/12/2022 08:21   DG Chest Port 1 View  Result Date: 04/13/2022 CLINICAL DATA:  Possible sepsis EXAM: PORTABLE CHEST 1 VIEW COMPARISON:  CXR 02/22/19 FINDINGS: No pleural effusion. No pneumothorax. Unchanged  cardiac contours. No radiographically apparent displaced rib fractures. Compared to prior exam there is slight interval increase in prominence of bilateral interstitial opacities, which could suggest pulmonary venous congestion or atypical infection. No acute abnormality is noted in the visualized upper abdomen. Degenerative changes of the bilateral AC joints. IMPRESSION: Compared to prior exam there is slight interval increase in prominence of bilateral interstitial opacities, which could suggest pulmonary venous congestion or atypical infection. Electronically Signed   By: Marin Roberts M.D.   On: 13-Apr-2022 19:36    Microbiology: Recent Results (from the past 240 hour(s))  Resp panel by RT-PCR (RSV, Flu A&B, Covid) Anterior Nasal Swab     Status: None   Collection Time: April 13, 2022  7:07 PM   Specimen: Anterior Nasal Swab  Result Value Ref Range Status   SARS Coronavirus 2 by RT PCR NEGATIVE NEGATIVE Final    Comment: (NOTE) SARS-CoV-2 target nucleic acids are NOT DETECTED.  The SARS-CoV-2 RNA is generally detectable in upper respiratory specimens during the acute phase of infection. The lowest concentration of SARS-CoV-2 viral copies this assay can detect is 138 copies/mL. A negative result does not preclude SARS-Cov-2 infection and should not be used as the sole basis for treatment or other patient management decisions. A negative result may occur with  improper specimen collection/handling, submission of specimen other than nasopharyngeal swab, presence of viral mutation(s) within the areas targeted by this assay, and inadequate number of viral copies(<138 copies/mL). A negative result must be combined with clinical observations, patient history, and epidemiological information. The expected result is Negative.  Fact Sheet for Patients:  EntrepreneurPulse.com.au  Fact Sheet for Healthcare Providers:  IncredibleEmployment.be  This test is no t yet  approved or cleared by the [place]  States FDA and  has been authorized for detection and/or diagnosis of SARS-CoV-2 by FDA under an Emergency Use Authorization (EUA). This EUA will remain  in effect (meaning this test can be used) for the duration of the COVID-19 declaration under Section 564(b)(1) of the Act, 21 U.S.C.section 360bbb-3(b)(1), unless the authorization is terminated  or revoked sooner.       Influenza A by PCR NEGATIVE NEGATIVE Final   Influenza B by PCR NEGATIVE NEGATIVE Final    Comment: (NOTE) The Xpert Xpress SARS-CoV-2/FLU/RSV plus assay is intended as an aid in the diagnosis of influenza from Nasopharyngeal swab specimens and should not be used as a sole basis for treatment. Nasal washings and aspirates are unacceptable for Xpert Xpress SARS-CoV-2/FLU/RSV testing.  Fact Sheet for Patients: EntrepreneurPulse.com.au  Fact Sheet for Healthcare Providers: IncredibleEmployment.be  This test is not yet approved or cleared by the [place] FDA and has been authorized for detection and/or diagnosis of SARS-CoV-2 by FDA under an Emergency Use Authorization (EUA). This EUA will remain in effect (meaning this test can be used) for the duration of the COVID-19 declaration under Section 564(b)(1) of the Act, 21 U.S.C. section 360bbb-3(b)(1), unless the authorization is terminated or revoked.     Resp Syncytial Virus by PCR NEGATIVE NEGATIVE Final    Comment: (NOTE) Fact Sheet for Patients: EntrepreneurPulse.com.au  Fact Sheet for Healthcare Providers: IncredibleEmployment.be  This test is not yet approved or cleared by the [place] FDA and has been authorized for detection and/or diagnosis of SARS-CoV-2 by FDA under an Emergency Use Authorization (EUA). This EUA will remain in effect (meaning this test can be used) for the duration of the COVID-19 declaration under Section 564(b)(1) of  the Act, 21 U.S.C. section 360bbb-3(b)(1), unless the authorization is terminated or revoked.  Performed at [facility], [address] [place], [place] [address]   Blood Culture (routine x 2)     Status: None   Collection Time: 04/09/2022  8:20 PM   Specimen: BLOOD RIGHT ARM  Result Value Ref Range Status   Specimen Description BLOOD RIGHT ARM  Final   Special Requests   Final    BOTTLES DRAWN AEROBIC AND ANAEROBIC Blood Culture results may not be optimal due to an inadequate volume of blood received in culture bottles   Culture   Final    NO GROWTH 5 DAYS Performed at [facility], [address], [place], [place] [address]    Report Status 04/15/2022 FINAL  Final  Urine Culture     Status: Abnormal   Collection Time: 03/19/2022  9:49 PM   Specimen: Urine, Random  Result Value Ref Range Status   Specimen Description   Final    URINE, RANDOM Performed at [facility], [address] [place], [place] [address]    Special Requests   Final    NONE Reflexed from [id number] Performed at [facility], [address] [place], [place] [address]    Culture (A)  Final    20,000 COLONIES/mL ESCHERICHIA COLI CONFIRMED CARBAPENEMASE RESISTANT ENTEROBACTERIACAE CRITICAL RESULT CALLED TO, READ BACK BY AND VERIFIED WITH: RN Eldridge Abrahams [id number] AT 56 AM BY CM CRITICAL RESULT CALLED TO, READ BACK BY AND VERIFIED WITH: [facility] [id number] AT 46 AM BY CM HEALTH DEPARTMENT NOTIFIED Performed at [facility], [place] [address] [place], [place] [address]    Report Status 04/14/2022 FINAL  Final   Organism ID, Bacteria ESCHERICHIA COLI (A)  Final  Susceptibility   Escherichia coli - MIC*    AMPICILLIN >=32 RESISTANT Resistant     CEFAZOLIN >=64 RESISTANT Resistant     CEFEPIME 16 RESISTANT Resistant     CEFTRIAXONE 32 RESISTANT Resistant     CIPROFLOXACIN <=0.25 SENSITIVE Sensitive     GENTAMICIN <=1 SENSITIVE Sensitive      IMIPENEM >=16 RESISTANT Resistant     NITROFURANTOIN <=16 SENSITIVE Sensitive     TRIMETH/SULFA <=20 SENSITIVE Sensitive     AMPICILLIN/SULBACTAM >=32 RESISTANT Resistant     PIP/TAZO >=128 RESISTANT Resistant     * 20,000 COLONIES/mL ESCHERICHIA COLI  Blood Culture (routine x 2)     Status: None   Collection Time: 03/18/2022 10:19 PM   Specimen: BLOOD RIGHT ARM  Result Value Ref Range Status   Specimen Description BLOOD RIGHT ARM  Final   Special Requests   Final    BOTTLES DRAWN AEROBIC AND ANAEROBIC Blood Culture results may not be optimal due to an excessive volume of blood received in culture bottles   Culture   Final    NO GROWTH 5 DAYS Performed at [facility], [address], [place], [place] [address]    Report Status 04/15/2022 FINAL  Final  MRSA Next Gen by PCR, Nasal     Status: None   Collection Time: 04/11/22 11:14 AM   Specimen: Nasal Mucosa; Nasal Swab  Result Value Ref Range Status   MRSA by PCR Next Gen NOT DETECTED NOT DETECTED Final    Comment: (NOTE) The GeneXpert MRSA Assay (FDA approved for NASAL specimens only), is one component of a comprehensive MRSA colonization surveillance program. It is not intended to diagnose MRSA infection nor to guide or monitor treatment for MRSA infections. Test performance is not FDA approved in patients less than 16 years old. Performed at [facility], [address] [place], [place] [address]   Carbapenem Resistance Panel     Status: Abnormal   Collection Time: 04/11/22  9:30 PM  Result Value Ref Range Status   Carba Resistance IMP Gene NOT DETECTED NOT DETECTED Final   Carba Resistance VIM Gene NOT DETECTED NOT DETECTED Final   Carba Resistance NDM Gene NOT DETECTED NOT DETECTED Final   Carba Resistance KPC Gene DETECTED (A) NOT DETECTED Final    Comment: RESULT CALLED TO, READ BACK BY AND VERIFIED WITH: RN Eldridge Abrahams [id number] AT 908 AM BY CM    Carba Resistance OXA48 Gene NOT DETECTED NOT DETECTED Final     Comment: (NOTE) Cepheid Carba-R is an FDA-cleared nucleic acid amplification test  (NAAT)for the detection and differentiation of genes encoding the  most prevalent carbapenemases in bacterial isolate samples. Carbapenemase gene identification and implementation of comprehensive  infection control measures are recommended by the CDC to prevent the  spread of the resistant organisms. Performed at [facility], [place] [address], [place], [place] [address]      Labs: Basic Metabolic Panel: Recent Labs  Lab 04/12/22 0325 04/13/22 0605  NA 137 140  K 3.6 3.9  CL 108 110  CO2 21* 21*  GLUCOSE 146* 190*  BUN 49* 44*  CREATININE 1.84* 1.92*  CALCIUM 9.0 9.6  MG 1.5* 2.1  PHOS 2.9 2.5   Liver Function Tests: Recent Labs  Lab 04/12/22 0325  AST 44*  ALT 18  ALKPHOS 95  BILITOT 0.6  PROT 5.6*  ALBUMIN 1.7*   No results for input(s): "LIPASE", "AMYLASE" in the last 168 hours. No results for input(s): "  AMMONIA" in the last 168 hours. CBC: Recent Labs  Lab 04/12/22 0325 04/13/22 0605  WBC 11.8* 10.9*  NEUTROABS 10.0* 8.9*  HGB 10.0* 9.1*  HCT 32.7* 30.1*  MCV 87.4 88.8  PLT 394 287   Cardiac Enzymes: No results for input(s): "CKTOTAL", "CKMB", "CKMBINDEX", "TROPONINI" in the last 168 hours. D-Dimer No results for input(s): "DDIMER" in the last 72 hours. BNP: Invalid input(s): "POCBNP" CBG: Recent Labs  Lab 04/12/22 0746 04/12/22 1136 04/12/22 2136 04/13/22 0719 04/13/22 1059  GLUCAP 126* 156* 234* 154* 166*   Anemia work up No results for input(s): "VITAMINB12", "FOLATE", "FERRITIN", "TIBC", "IRON", "RETICCTPCT" in the last 72 hours. Urinalysis    Component Value Date/Time   COLORURINE YELLOW (A) 04/09/2022 2149   APPEARANCEUR TURBID (A) 03/26/2022 2149   APPEARANCEUR Cloudy (A) 11/29/2017 1113   LABSPEC 1.010 04/07/2022 2149   PHURINE 6.0 03/30/2022 2149   GLUCOSEU 50 (A) 04/02/2022 2149   HGBUR LARGE (A) 03/19/2022 2149   BILIRUBINUR  NEGATIVE 04/05/2022 2149   BILIRUBINUR Negative 11/29/2017 [place] 03/30/2022 2149   PROTEINUR 100 (A) 04/01/2022 2149   NITRITE NEGATIVE 04/09/2022 2149   LEUKOCYTESUR LARGE (A) 04/11/2022 2149   Sepsis Labs Recent Labs  Lab 04/12/22 0325 04/13/22 0605  WBC 11.8* 10.9*       SIGNED:  Enzo Bi, MD  [facility] , 2:46 PM Pager   If 7PM-7AM, please contact night-coverage [email or web address] Password [email or web address]

## 2022-05-16 NOTE — Progress Notes (Signed)
Patient's daughter called primary RN in room at 9; upon assessment, patient was pronounced deceased by primary RN and Soyla Murphy, RN at 98; Dr. Billie Ruddy was informed of patient's passing.

## 2022-05-16 NOTE — TOC Transition Note (Signed)
Transition of Care [facility]) - CM/SW Discharge Note   Patient Details  Name: Neomi Viveros MRN: [id number] Date of Birth: 1940/04/25  [facility] CM/SW Contact:  Candie Chroman, LCSW Phone Number: , 1:48 PM   Clinical Narrative: Patient has orders to discharge to the [facility] today. RN will call report. [facility] liaison will set up EMS transport. No further concerns. CSW signing off.    Final next level of care: [place] Barriers to Discharge: Barriers Resolved   Patient Goals and CMS Choice      Discharge Placement                Patient chooses bed at: Other - please specify in the comment section below: [facility]) Patient to be transferred to facility by: EMS   Patient and family notified of of transfer:   Discharge Plan and Services Additional resources added to the After Visit Summary for                                       Social Determinants of Health (SDOH) Interventions SDOH Screenings   Tobacco Use: Medium Risk (04/11/2022)     Readmission Risk Interventions     No data to display

## 2022-05-16 NOTE — Care Management Important Message (Signed)
Important Message  Patient Details  Name: Danielle Vang MRN: [id number] Date of Birth: Dec 20, 1940   Medicare Important Message Given:  Yes     Loann Quill , 4:04 PM

## 2022-05-16 DEATH — deceased
